# Patient Record
Sex: Female | Born: 1965 | Race: White | Hispanic: No | Marital: Married | Smoking: Never smoker
Health system: Southern US, Community
[De-identification: ages and names within clinical notes are randomized; demographics above are authoritative.]

## PROBLEM LIST (undated history)

## (undated) DIAGNOSIS — F419 Anxiety disorder, unspecified: Secondary | ICD-10-CM

## (undated) DIAGNOSIS — E559 Vitamin D deficiency, unspecified: Secondary | ICD-10-CM

## (undated) DIAGNOSIS — C801 Malignant (primary) neoplasm, unspecified: Secondary | ICD-10-CM

## (undated) DIAGNOSIS — K635 Polyp of colon: Secondary | ICD-10-CM

## (undated) HISTORY — PX: TUBAL LIGATION: SHX77

## (undated) HISTORY — DX: Anxiety disorder, unspecified: F41.9

## (undated) HISTORY — DX: Polyp of colon: K63.5

## (undated) HISTORY — DX: Vitamin D deficiency, unspecified: E55.9

---
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
## 1997-09-16 ENCOUNTER — Inpatient Hospital Stay (HOSPITAL_COMMUNITY): Admission: AD | Admit: 1997-09-16 | Discharge: 1997-09-16 | Payer: Self-pay | Admitting: Obstetrics and Gynecology

## 1997-09-19 ENCOUNTER — Inpatient Hospital Stay (HOSPITAL_COMMUNITY): Admission: AD | Admit: 1997-09-19 | Discharge: 1997-09-21 | Payer: Self-pay | Admitting: *Deleted

## 1999-01-13 ENCOUNTER — Other Ambulatory Visit: Admission: RE | Admit: 1999-01-13 | Discharge: 1999-01-13 | Payer: Self-pay | Admitting: *Deleted

## 2000-01-13 ENCOUNTER — Other Ambulatory Visit: Admission: RE | Admit: 2000-01-13 | Discharge: 2000-01-13 | Payer: Self-pay | Admitting: *Deleted

## 2000-12-15 ENCOUNTER — Other Ambulatory Visit: Admission: RE | Admit: 2000-12-15 | Discharge: 2000-12-15 | Payer: Self-pay | Admitting: Family Medicine

## 2002-09-14 ENCOUNTER — Inpatient Hospital Stay (HOSPITAL_COMMUNITY): Admission: AD | Admit: 2002-09-14 | Discharge: 2002-09-17 | Payer: Self-pay | Admitting: Obstetrics and Gynecology

## 2002-09-21 ENCOUNTER — Emergency Department (HOSPITAL_COMMUNITY): Admission: EM | Admit: 2002-09-21 | Discharge: 2002-09-21 | Payer: Self-pay | Admitting: Emergency Medicine

## 2002-09-21 ENCOUNTER — Encounter: Payer: Self-pay | Admitting: *Deleted

## 2002-10-17 ENCOUNTER — Other Ambulatory Visit: Admission: RE | Admit: 2002-10-17 | Discharge: 2002-10-17 | Payer: Self-pay | Admitting: Obstetrics and Gynecology

## 2003-08-17 HISTORY — PX: OTHER SURGICAL HISTORY: SHX169

## 2006-06-20 ENCOUNTER — Ambulatory Visit: Payer: Self-pay | Admitting: Oncology

## 2006-07-16 ENCOUNTER — Ambulatory Visit: Payer: Self-pay | Admitting: Oncology

## 2007-02-14 ENCOUNTER — Ambulatory Visit: Payer: Self-pay | Admitting: Oncology

## 2007-02-22 ENCOUNTER — Ambulatory Visit: Payer: Self-pay | Admitting: Oncology

## 2007-03-17 ENCOUNTER — Ambulatory Visit: Payer: Self-pay | Admitting: Oncology

## 2007-08-14 ENCOUNTER — Ambulatory Visit: Payer: Self-pay | Admitting: Oncology

## 2007-08-17 ENCOUNTER — Ambulatory Visit: Payer: Self-pay | Admitting: Oncology

## 2007-08-21 ENCOUNTER — Ambulatory Visit: Payer: Self-pay | Admitting: Oncology

## 2007-09-17 ENCOUNTER — Ambulatory Visit: Payer: Self-pay | Admitting: Oncology

## 2008-02-19 ENCOUNTER — Ambulatory Visit: Payer: Self-pay | Admitting: Oncology

## 2008-03-16 ENCOUNTER — Ambulatory Visit: Payer: Self-pay | Admitting: Oncology

## 2008-08-12 ENCOUNTER — Ambulatory Visit: Payer: Self-pay | Admitting: Oncology

## 2008-08-16 ENCOUNTER — Ambulatory Visit: Payer: Self-pay | Admitting: Oncology

## 2008-08-19 ENCOUNTER — Ambulatory Visit: Payer: Self-pay | Admitting: Internal Medicine

## 2008-09-16 ENCOUNTER — Ambulatory Visit: Payer: Self-pay | Admitting: Oncology

## 2008-09-16 ENCOUNTER — Ambulatory Visit: Payer: Self-pay | Admitting: Internal Medicine

## 2008-11-27 DIAGNOSIS — Z85820 Personal history of malignant melanoma of skin: Secondary | ICD-10-CM | POA: Insufficient documentation

## 2009-01-09 DIAGNOSIS — IMO0002 Reserved for concepts with insufficient information to code with codable children: Secondary | ICD-10-CM | POA: Insufficient documentation

## 2009-02-13 ENCOUNTER — Ambulatory Visit: Payer: Self-pay | Admitting: Oncology

## 2009-02-19 ENCOUNTER — Ambulatory Visit: Payer: Self-pay | Admitting: Oncology

## 2009-03-16 ENCOUNTER — Ambulatory Visit: Payer: Self-pay | Admitting: Oncology

## 2009-07-08 DIAGNOSIS — E559 Vitamin D deficiency, unspecified: Secondary | ICD-10-CM | POA: Insufficient documentation

## 2010-02-12 ENCOUNTER — Ambulatory Visit: Payer: Self-pay | Admitting: Oncology

## 2010-02-13 ENCOUNTER — Ambulatory Visit: Payer: Self-pay | Admitting: Oncology

## 2010-02-20 ENCOUNTER — Ambulatory Visit: Payer: Self-pay | Admitting: Oncology

## 2010-03-16 ENCOUNTER — Ambulatory Visit: Payer: Self-pay | Admitting: Oncology

## 2010-06-10 ENCOUNTER — Ambulatory Visit: Payer: Self-pay | Admitting: Family Medicine

## 2011-02-15 ENCOUNTER — Ambulatory Visit: Payer: Self-pay | Admitting: Oncology

## 2011-02-22 ENCOUNTER — Ambulatory Visit: Payer: Self-pay | Admitting: Oncology

## 2011-03-17 ENCOUNTER — Ambulatory Visit: Payer: Self-pay | Admitting: Oncology

## 2011-04-27 ENCOUNTER — Ambulatory Visit: Payer: Self-pay | Admitting: Family Medicine

## 2012-09-11 IMAGING — CR DG CHEST 2V
1 series · 2 of 2 positions shown · non-contrast
Comparison: none

REASON FOR EXAM: cxr h/o melenoma
COMMENTS:

PROCEDURE:     DXR - DXR CHEST PA (OR AP) AND LATERAL  - February 15, 2011  [DATE]
RESULT:     Comparison: 02/12/2010

[Series 1: view not recorded · 0.17mm/px · 2 of 2 slices shown]
[im 1/2]
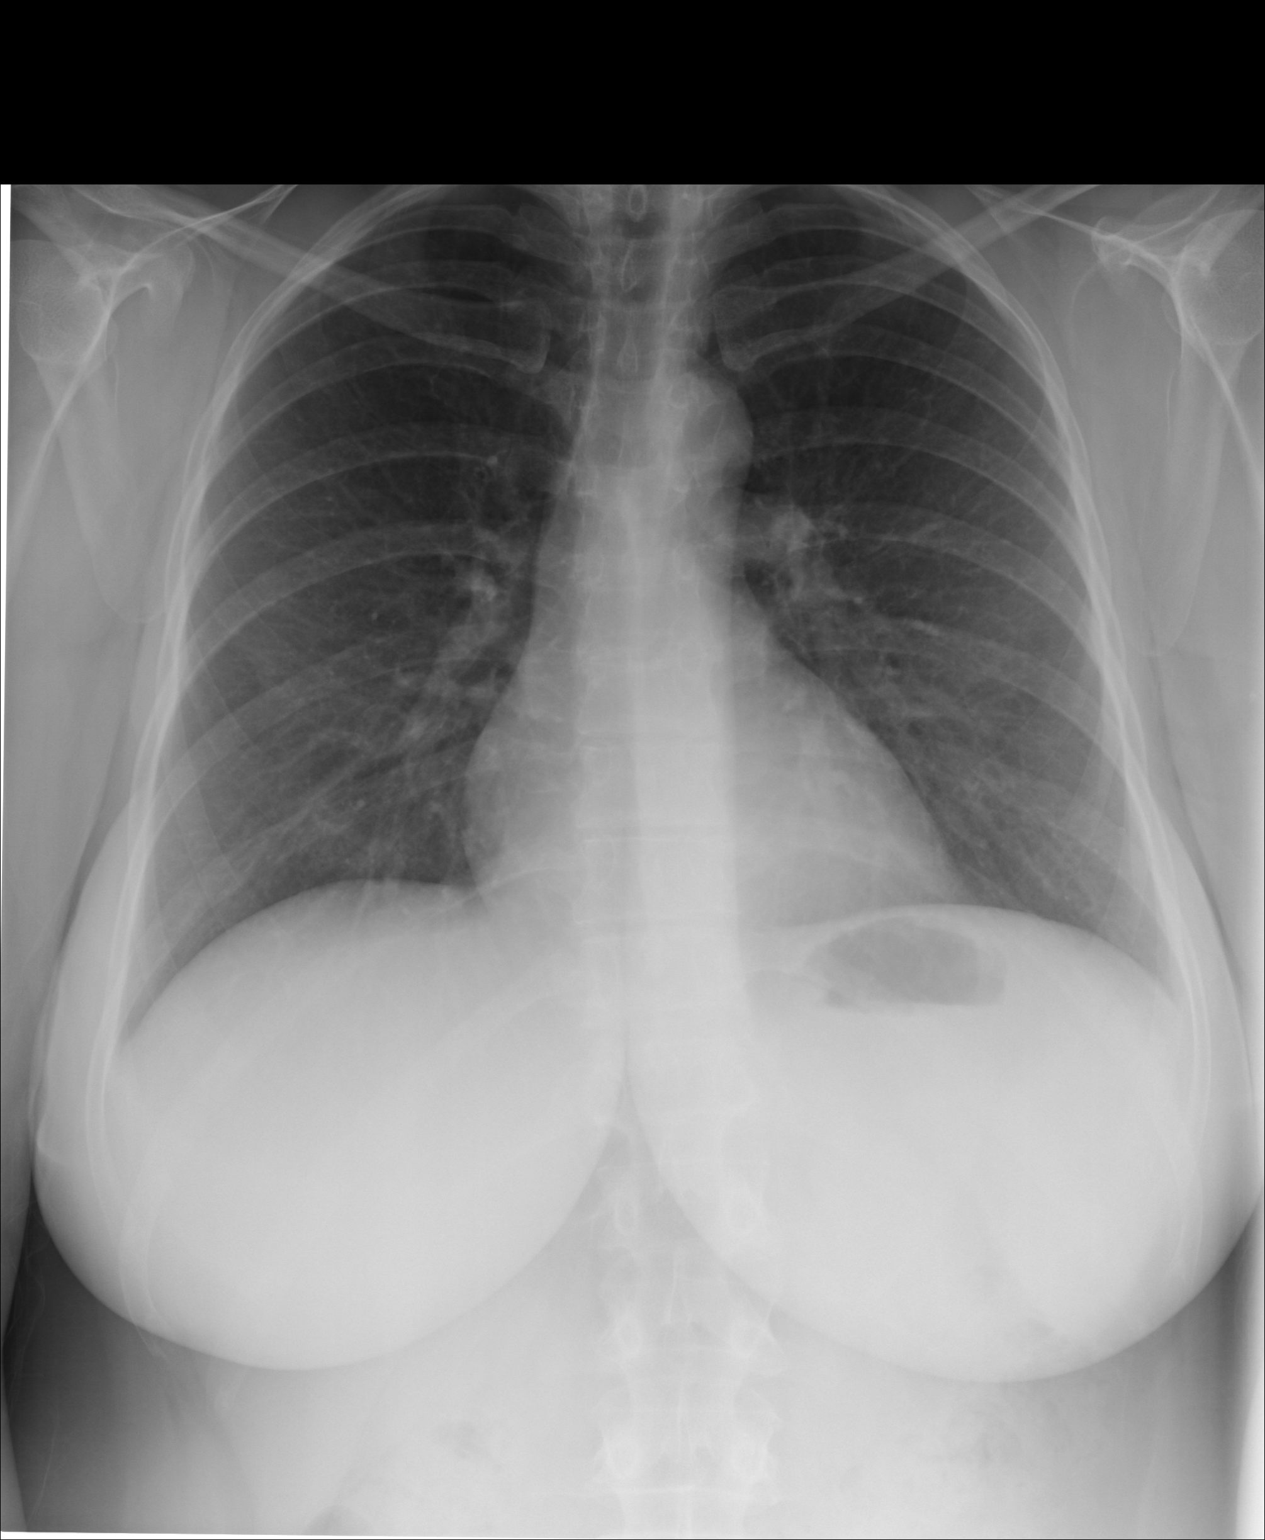
[im 2/2]
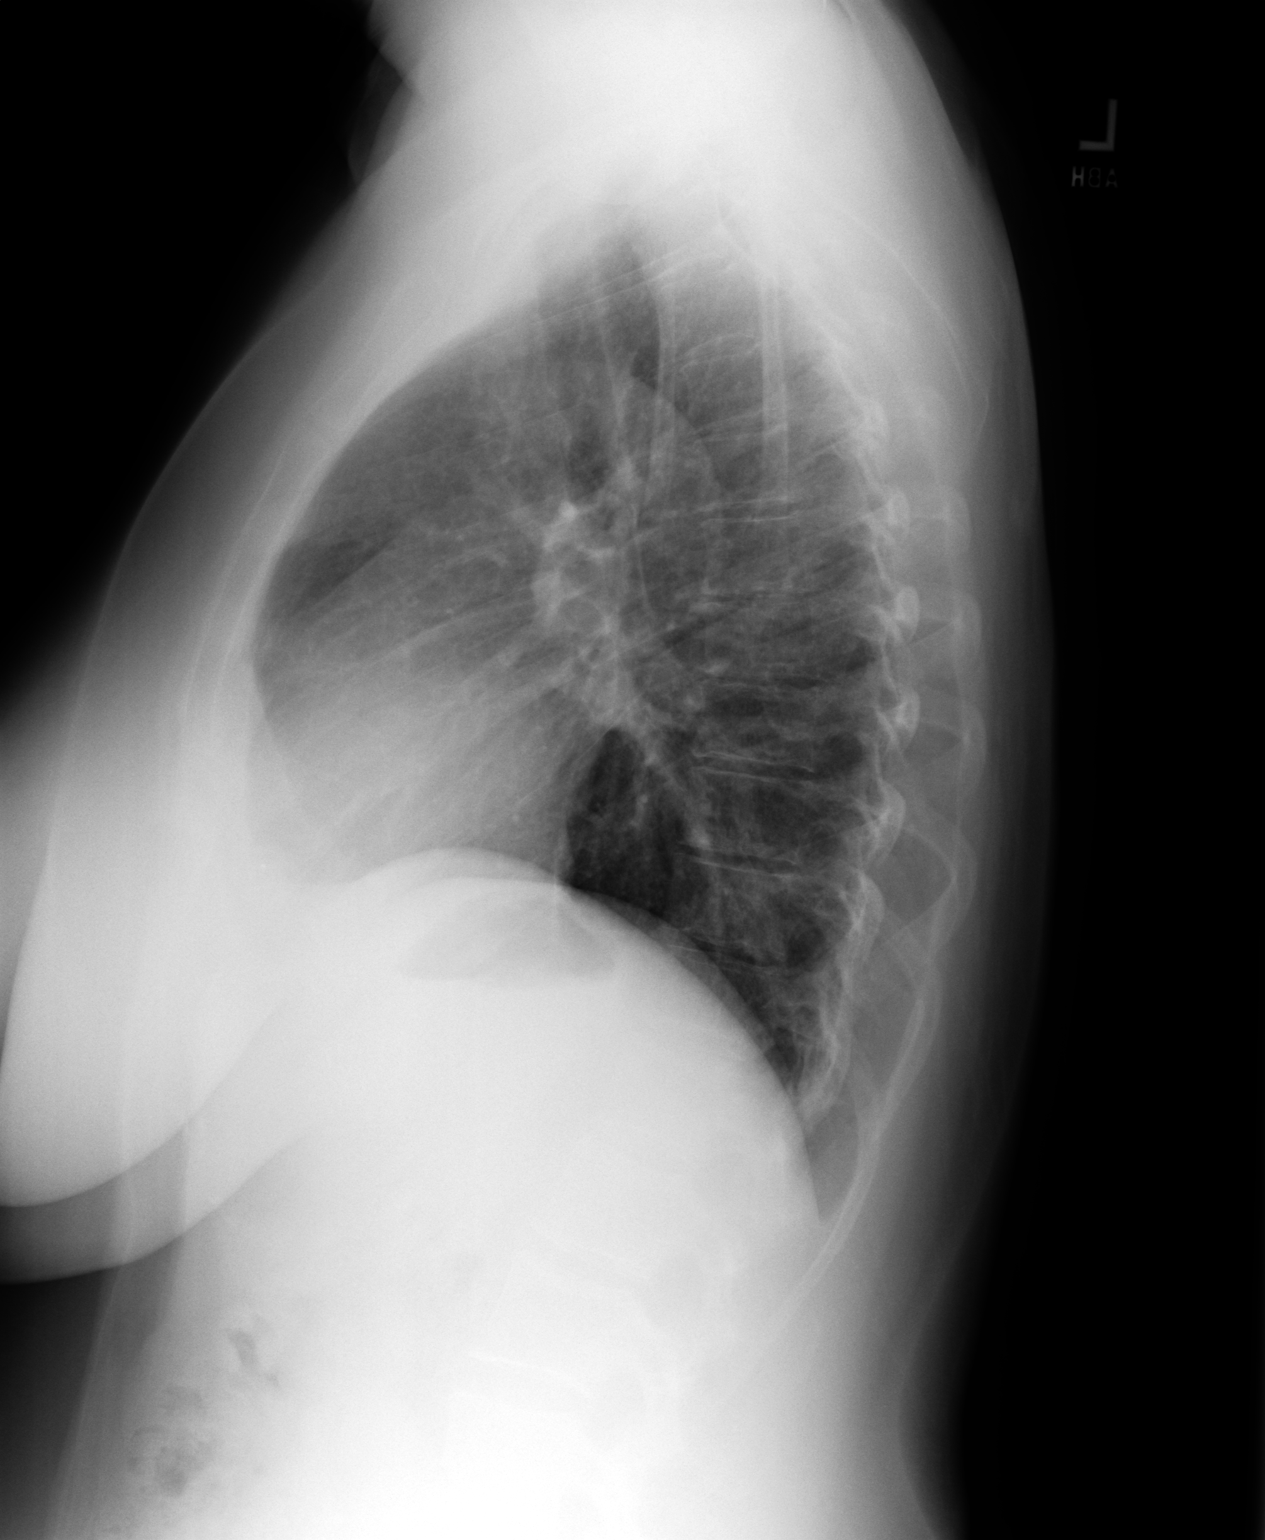

[2 of 2 positions shown; findings below may reference images not displayed]

FINDINGS: The heart and mediastinum are within normal limits. The lungs are clear.
IMPRESSION: No acute cardiopulmonary disease.

## 2013-09-18 LAB — CBC AND DIFFERENTIAL
HCT: 40 % (ref 36–46)
Hemoglobin: 13.3 g/dL (ref 12.0–16.0)
PLATELETS: 237 10*3/uL (ref 150–399)
WBC: 5.3 10^3/mL

## 2013-09-18 LAB — LIPID PANEL
CHOLESTEROL: 166 mg/dL (ref 0–200)
HDL: 63 mg/dL (ref 35–70)
LDL Cholesterol: 92 mg/dL
TRIGLYCERIDES: 57 mg/dL (ref 40–160)

## 2013-09-18 LAB — TSH: TSH: 2.38 u[IU]/mL (ref 0.41–5.90)

## 2013-09-18 LAB — HM PAP SMEAR: HM Pap smear: NEGATIVE

## 2013-09-25 ENCOUNTER — Ambulatory Visit: Payer: Self-pay | Admitting: Family Medicine

## 2015-02-18 ENCOUNTER — Other Ambulatory Visit: Payer: Self-pay | Admitting: Family Medicine

## 2015-02-18 DIAGNOSIS — F32A Depression, unspecified: Secondary | ICD-10-CM | POA: Insufficient documentation

## 2015-02-18 DIAGNOSIS — F329 Major depressive disorder, single episode, unspecified: Secondary | ICD-10-CM | POA: Insufficient documentation

## 2015-02-18 HISTORY — DX: Depression, unspecified: F32.A

## 2015-09-27 ENCOUNTER — Other Ambulatory Visit: Payer: Self-pay | Admitting: Family Medicine

## 2015-09-27 DIAGNOSIS — F32A Depression, unspecified: Secondary | ICD-10-CM

## 2015-09-27 DIAGNOSIS — F329 Major depressive disorder, single episode, unspecified: Secondary | ICD-10-CM

## 2015-09-29 NOTE — Telephone Encounter (Signed)
Last OV 11/2014  Thanks,   -Laura  

## 2015-11-04 ENCOUNTER — Other Ambulatory Visit: Payer: Self-pay | Admitting: Family Medicine

## 2015-11-04 NOTE — Telephone Encounter (Signed)
Last OV 11/2014  Thanks,   -Laura  

## 2015-12-26 ENCOUNTER — Other Ambulatory Visit: Payer: Self-pay | Admitting: Family Medicine

## 2015-12-26 DIAGNOSIS — F329 Major depressive disorder, single episode, unspecified: Secondary | ICD-10-CM

## 2015-12-26 DIAGNOSIS — F32A Depression, unspecified: Secondary | ICD-10-CM

## 2015-12-26 NOTE — Telephone Encounter (Signed)
Last OV 11/2014  Thanks,   -Valente Fosberg  

## 2016-01-05 DIAGNOSIS — Z6836 Body mass index (BMI) 36.0-36.9, adult: Secondary | ICD-10-CM | POA: Insufficient documentation

## 2016-01-05 DIAGNOSIS — R102 Pelvic and perineal pain: Secondary | ICD-10-CM | POA: Insufficient documentation

## 2016-01-05 DIAGNOSIS — F430 Acute stress reaction: Secondary | ICD-10-CM | POA: Insufficient documentation

## 2016-01-05 DIAGNOSIS — F419 Anxiety disorder, unspecified: Secondary | ICD-10-CM | POA: Insufficient documentation

## 2016-01-05 DIAGNOSIS — N920 Excessive and frequent menstruation with regular cycle: Secondary | ICD-10-CM | POA: Insufficient documentation

## 2016-01-05 HISTORY — DX: Acute stress reaction: F43.0

## 2016-01-06 ENCOUNTER — Ambulatory Visit (INDEPENDENT_AMBULATORY_CARE_PROVIDER_SITE_OTHER): Payer: BC Managed Care – PPO | Admitting: Physician Assistant

## 2016-01-06 ENCOUNTER — Encounter: Payer: Self-pay | Admitting: Physician Assistant

## 2016-01-06 VITALS — BP 108/72 | HR 72 | Temp 98.7°F | Resp 16 | Ht 63.0 in | Wt 192.2 lb

## 2016-01-06 DIAGNOSIS — Z0000 Encounter for general adult medical examination without abnormal findings: Secondary | ICD-10-CM | POA: Diagnosis not present

## 2016-01-06 DIAGNOSIS — Z1239 Encounter for other screening for malignant neoplasm of breast: Secondary | ICD-10-CM

## 2016-01-06 DIAGNOSIS — Z13220 Encounter for screening for lipoid disorders: Secondary | ICD-10-CM

## 2016-01-06 DIAGNOSIS — E559 Vitamin D deficiency, unspecified: Secondary | ICD-10-CM

## 2016-01-06 DIAGNOSIS — Z124 Encounter for screening for malignant neoplasm of cervix: Secondary | ICD-10-CM | POA: Diagnosis not present

## 2016-01-06 DIAGNOSIS — Z136 Encounter for screening for cardiovascular disorders: Secondary | ICD-10-CM | POA: Diagnosis not present

## 2016-01-06 DIAGNOSIS — Z3041 Encounter for surveillance of contraceptive pills: Secondary | ICD-10-CM | POA: Diagnosis not present

## 2016-01-06 DIAGNOSIS — F32A Depression, unspecified: Secondary | ICD-10-CM

## 2016-01-06 DIAGNOSIS — F329 Major depressive disorder, single episode, unspecified: Secondary | ICD-10-CM | POA: Diagnosis not present

## 2016-01-06 MED ORDER — DESOGESTREL-ETHINYL ESTRADIOL 0.15-0.02/0.01 MG (21/5) PO TABS: 1.0000 | ORAL_TABLET | Freq: Every day | ORAL | Status: DC

## 2016-01-06 MED ORDER — SERTRALINE HCL 50 MG PO TABS: 100.0000 mg | ORAL_TABLET | Freq: Every day | ORAL | Status: DC

## 2016-01-06 NOTE — Patient Instructions (Signed)
Health Maintenance, Female Adopting a healthy lifestyle and getting preventive care can go a long way to promote health and wellness. Talk with your health care provider about what schedule of regular examinations is right for you. This is a good chance for you to check in with your provider about disease prevention and staying healthy. In between checkups, there are plenty of things you can do on your own. Experts have done a lot of research about which lifestyle changes and preventive measures are most likely to keep you healthy. Ask your health care provider for more information. WEIGHT AND DIET  Eat a healthy diet  Be sure to include plenty of vegetables, fruits, low-fat dairy products, and lean protein.  Do not eat a lot of foods high in solid fats, added sugars, or salt.  Get regular exercise. This is one of the most important things you can do for your health.  Most adults should exercise for at least 150 minutes each week. The exercise should increase your heart rate and make you sweat (moderate-intensity exercise).  Most adults should also do strengthening exercises at least twice a week. This is in addition to the moderate-intensity exercise.  Maintain a healthy weight  Body mass index (BMI) is a measurement that can be used to identify possible weight problems. It estimates body fat based on height and weight. Your health care provider can help determine your BMI and help you achieve or maintain a healthy weight.  For females 20 years of age and older:   A BMI below 18.5 is considered underweight.  A BMI of 18.5 to 24.9 is normal.  A BMI of 25 to 29.9 is considered overweight.  A BMI of 30 and above is considered obese.  Watch levels of cholesterol and blood lipids  You should start having your blood tested for lipids and cholesterol at 48 years of age, then have this test every 5 years.  You may need to have your cholesterol levels checked more often if:  Your lipid  or cholesterol levels are high.  You are older than 48 years of age.  You are at high risk for heart disease.  CANCER SCREENING   Lung Cancer  Lung cancer screening is recommended for adults 55-80 years old who are at high risk for lung cancer because of a history of smoking.  A yearly low-dose CT scan of the lungs is recommended for people who:  Currently smoke.  Have quit within the past 15 years.  Have at least a 30-pack-year history of smoking. A pack year is smoking an average of one pack of cigarettes a day for 1 year.  Yearly screening should continue until it has been 15 years since you quit.  Yearly screening should stop if you develop a health problem that would prevent you from having lung cancer treatment.  Breast Cancer  Practice breast self-awareness. This means understanding how your breasts normally appear and feel.  It also means doing regular breast self-exams. Let your health care provider know about any changes, no matter how small.  If you are in your 20s or 30s, you should have a clinical breast exam (CBE) by a health care provider every 1-3 years as part of a regular health exam.  If you are 40 or older, have a CBE every year. Also consider having a breast X-ray (mammogram) every year.  If you have a family history of breast cancer, talk to your health care provider about genetic screening.  If you   are at high risk for breast cancer, talk to your health care provider about having an MRI and a mammogram every year.  Breast cancer gene (BRCA) assessment is recommended for women who have family members with BRCA-related cancers. BRCA-related cancers include:  Breast.  Ovarian.  Tubal.  Peritoneal cancers.  Results of the assessment will determine the need for genetic counseling and BRCA1 and BRCA2 testing. Cervical Cancer Your health care provider may recommend that you be screened regularly for cancer of the pelvic organs (ovaries, uterus, and  vagina). This screening involves a pelvic examination, including checking for microscopic changes to the surface of your cervix (Pap test). You may be encouraged to have this screening done every 3 years, beginning at age 21.  For women ages 30-65, health care providers may recommend pelvic exams and Pap testing every 3 years, or they may recommend the Pap and pelvic exam, combined with testing for human papilloma virus (HPV), every 5 years. Some types of HPV increase your risk of cervical cancer. Testing for HPV may also be done on women of any age with unclear Pap test results.  Other health care providers may not recommend any screening for nonpregnant women who are considered low risk for pelvic cancer and who do not have symptoms. Ask your health care provider if a screening pelvic exam is right for you.  If you have had past treatment for cervical cancer or a condition that could lead to cancer, you need Pap tests and screening for cancer for at least 20 years after your treatment. If Pap tests have been discontinued, your risk factors (such as having a new sexual partner) need to be reassessed to determine if screening should resume. Some women have medical problems that increase the chance of getting cervical cancer. In these cases, your health care provider may recommend more frequent screening and Pap tests. Colorectal Cancer  This type of cancer can be detected and often prevented.  Routine colorectal cancer screening usually begins at 48 years of age and continues through 48 years of age.  Your health care provider may recommend screening at an earlier age if you have risk factors for colon cancer.  Your health care provider may also recommend using home test kits to check for hidden blood in the stool.  A small camera at the end of a tube can be used to examine your colon directly (sigmoidoscopy or colonoscopy). This is done to check for the earliest forms of colorectal  cancer.  Routine screening usually begins at age 50.  Direct examination of the colon should be repeated every 5-10 years through 48 years of age. However, you may need to be screened more often if early forms of precancerous polyps or small growths are found. Skin Cancer  Check your skin from head to toe regularly.  Tell your health care provider about any new moles or changes in moles, especially if there is a change in a mole's shape or color.  Also tell your health care provider if you have a mole that is larger than the size of a pencil eraser.  Always use sunscreen. Apply sunscreen liberally and repeatedly throughout the day.  Protect yourself by wearing long sleeves, pants, a wide-brimmed hat, and sunglasses whenever you are outside. HEART DISEASE, DIABETES, AND HIGH BLOOD PRESSURE   High blood pressure causes heart disease and increases the risk of stroke. High blood pressure is more likely to develop in:  People who have blood pressure in the high end   of the normal range (130-139/85-89 mm Hg).  People who are overweight or obese.  People who are African American.  If you are 38-23 years of age, have your blood pressure checked every 3-5 years. If you are 61 years of age or older, have your blood pressure checked every year. You should have your blood pressure measured twice--once when you are at a hospital or clinic, and once when you are not at a hospital or clinic. Record the average of the two measurements. To check your blood pressure when you are not at a hospital or clinic, you can use:  An automated blood pressure machine at a pharmacy.  A home blood pressure monitor.  If you are between 45 years and 39 years old, ask your health care provider if you should take aspirin to prevent strokes.  Have regular diabetes screenings. This involves taking a blood sample to check your fasting blood sugar level.  If you are at a normal weight and have a low risk for diabetes,  have this test once every three years after 48 years of age.  If you are overweight and have a high risk for diabetes, consider being tested at a younger age or more often. PREVENTING INFECTION  Hepatitis B  If you have a higher risk for hepatitis B, you should be screened for this virus. You are considered at high risk for hepatitis B if:  You were born in a country where hepatitis B is common. Ask your health care provider which countries are considered high risk.  Your parents were born in a high-risk country, and you have not been immunized against hepatitis B (hepatitis B vaccine).  You have HIV or AIDS.  You use needles to inject street drugs.  You live with someone who has hepatitis B.  You have had sex with someone who has hepatitis B.  You get hemodialysis treatment.  You take certain medicines for conditions, including cancer, organ transplantation, and autoimmune conditions. Hepatitis C  Blood testing is recommended for:  Everyone born from [birth year] through 1963.  Anyone with known risk factors for hepatitis C. Sexually transmitted infections (STIs)  You should be screened for sexually transmitted infections (STIs) including gonorrhea and chlamydia if:  You are sexually active and are younger than 48 years of age.  You are older than 48 years of age and your health care provider tells you that you are at risk for this type of infection.  Your sexual activity has changed since you were last screened and you are at an increased risk for chlamydia or gonorrhea. Ask your health care provider if you are at risk.  If you do not have HIV, but are at risk, it may be recommended that you take a prescription medicine daily to prevent HIV infection. This is called pre-exposure prophylaxis (PrEP). You are considered at risk if:  You are sexually active and do not regularly use condoms or know the HIV status of your partner(s).  You take drugs by injection.  You are sexually  active with a partner who has HIV. Talk with your health care provider about whether you are at high risk of being infected with HIV. If you choose to begin PrEP, you should first be tested for HIV. You should then be tested every 3 months for as long as you are taking PrEP.  PREGNANCY   If you are premenopausal and you may become pregnant, ask your health care provider about preconception counseling.  If you may  become pregnant, take 400 to 800 micrograms (mcg) of folic acid every day.  If you want to prevent pregnancy, talk to your health care provider about birth control (contraception). OSTEOPOROSIS AND MENOPAUSE   Osteoporosis is a disease in which the bones lose minerals and strength with aging. This can result in serious bone fractures. Your risk for osteoporosis can be identified using a bone density scan.  If you are 61 years of age or older, or if you are at risk for osteoporosis and fractures, ask your health care provider if you should be screened.  Ask your health care provider whether you should take a calcium or vitamin D supplement to lower your risk for osteoporosis.  Menopause may have certain physical symptoms and risks.  Hormone replacement therapy may reduce some of these symptoms and risks. Talk to your health care provider about whether hormone replacement therapy is right for you.  HOME CARE INSTRUCTIONS   Schedule regular health, dental, and eye exams.  Stay current with your immunizations.   Do not use any tobacco products including cigarettes, chewing tobacco, or electronic cigarettes.  If you are pregnant, do not drink alcohol.  If you are breastfeeding, limit how much and how often you drink alcohol.  Limit alcohol intake to no more than 1 drink per day for nonpregnant women. One drink equals 12 ounces of beer, 5 ounces of wine, or 1 ounces of hard liquor.  Do not use street drugs.  Do not share needles.  Ask your health care provider for help if  you need support or information about quitting drugs.  Tell your health care provider if you often feel depressed.  Tell your health care provider if you have ever been abused or do not feel safe at home.   This information is not intended to replace advice given to you by your health care provider. Make sure you discuss any questions you have with your health care provider.   Document Released: 02/15/2011 Document Revised: 08/23/2014 Document Reviewed: 07/04/2013 Elsevier Interactive Patient Education [employer].

## 2016-01-06 NOTE — Progress Notes (Signed)
Patient: Deborah Cole, Female    DOB: 11-May-1966, 50 y.o.   MRN: [id number] Visit Date: 01/06/2016  Today's Provider: Mar Daring, PA-C   Chief Complaint  Patient presents with  . Annual Exam   Subjective:    Annual physical exam [name] is a 50 y.o. female who presents today for health maintenance and complete physical. She feels well. She reports exercising 3 times a week she walks for 30 minutes and is on her feet all day at work. She reports she is sleeping well.  Mammogram:09/25/2013- BRADs:Negative,Cat 1 Pap:09/17/13-Negative Colonoscopy: N/A Tdap: 10/20/2010 -----------------------------------------------------------------   Review of Systems  Constitutional: Negative.   HENT: Negative.   Eyes: Negative.   Respiratory: Negative.   Cardiovascular: Negative.   Gastrointestinal: Negative.   Endocrine: Negative.   Genitourinary: Negative.   Musculoskeletal: Negative.   Skin: Negative.   Allergic/Immunologic: Negative.   Neurological: Negative.   Hematological: Negative.   Psychiatric/Behavioral: Negative.     Social History      She  reports that she has never smoked. She does not have any smokeless tobacco history on file. She reports that she does not drink alcohol or use illicit drugs.       Social History   Social History  . Marital Status: Married    Spouse Name: N/A  . Number of Children: N/A  . Years of Education: N/A   Social History Main Topics  . Smoking status: Never Smoker   . Smokeless tobacco: None  . Alcohol Use: No  . Drug Use: No  . Sexual Activity: Yes   Other Topics Concern  . None   Social History Narrative    Past Medical History  Diagnosis Date  . Vitamin D deficiency   . Anxiety      Patient Active Problem List   Diagnosis Date Noted  . Acute stress disorder 01/05/2016  . Anxiety 01/05/2016  . Adult BMI 30+ 01/05/2016  . Excess, menstruation 01/05/2016  . Adnexal pain 01/05/2016  .  Depression 02/18/2015  . Avitaminosis D 07/08/2009  . Elevation of level of transaminase or lactic acid dehydrogenase (LDH) 01/09/2009  . Melanoma of skin ([place]) 11/27/2008    Past Surgical History  Procedure Laterality Date  . History of melanoma excision  2005    Malignant  . S/p bunionectomy  1984  . Tubal ligation      Family History        Family Status  Relation Status Death Age  . Mother Alive   . Father Alive   . Sister Alive   . Daughter Alive   . Son Alive   . Son Alive         Her family history includes Cancer (age of onset: 1) in her maternal grandmother.    No Known Allergies  Previous Medications   CHOLECALCIFEROL (VITAMIN D) 1000 UNITS TABLET    Take by mouth.   DESOGESTREL-ETHINYL ESTRADIOL (PIMTREA) 0.15-0.02/0.01 MG (21/5) TABLET    Take 1 tablet by mouth daily. Pt needs to schedule an office visit before anymore refills.   KETOROLAC (ACULAR) 0.5 % OPHTHALMIC SOLUTION    instill 1 drop into affected eye four times a day   MULTIPLE VITAMIN TABLET    Take by mouth.   PSYLLIUM 28.3 % POWD    Take by mouth. Reported on 01/06/2016   SERTRALINE (ZOLOFT) 50 MG TABLET    take 2 tablets by mouth once daily  Patient Care Team: Mar Daring, PA-C as PCP - General (Family Medicine)     Objective:   Vitals: BP 108/72 mmHg  Pulse 72  Temp(Src) 98.7 F (37.1 C) (Oral)  Resp 16  Ht 5\' 3"  (1.6 m)  Wt 192 lb 3.2 oz (87.181 kg)  BMI 34.06 kg/m2  LMP 12/17/2015   Physical Exam  Constitutional: She is oriented to person, place, and time. She appears well-developed and well-nourished. No distress.  HENT:  Head: Normocephalic and atraumatic.  Right Ear: Hearing, tympanic membrane, external ear and ear canal normal.  Left Ear: Hearing, tympanic membrane, external ear and ear canal normal.  Nose: Nose normal.  Mouth/Throat: Uvula is midline, oropharynx is clear and moist and mucous membranes are normal. No oropharyngeal exudate.  Eyes: Conjunctivae and  EOM are normal. Pupils are equal, round, and reactive to light. Right eye exhibits no discharge. Left eye exhibits no discharge. No scleral icterus.  Neck: Normal range of motion. Neck supple. No JVD present. Carotid bruit is not present. No tracheal deviation present. No thyromegaly present.  Cardiovascular: Normal rate, regular rhythm, normal heart sounds and intact distal pulses.  Exam reveals no gallop and no friction rub.   No murmur heard. Pulmonary/Chest: Effort normal and breath sounds normal. No respiratory distress. She has no wheezes. She has no rales. She exhibits no tenderness. Right breast exhibits no inverted nipple, no mass, no nipple discharge, no skin change and no tenderness. Left breast exhibits no inverted nipple, no mass, no nipple discharge, no skin change and no tenderness. Breasts are symmetrical.  Abdominal: Soft. Bowel sounds are normal. She exhibits no distension and no mass. There is no tenderness. There is no rebound and no guarding. Hernia confirmed negative in the right inguinal area and confirmed negative in the left inguinal area.  Genitourinary: Rectum normal, vagina normal and uterus normal. No breast swelling, tenderness, discharge or bleeding. Pelvic exam was performed with patient supine. There is no rash, tenderness, lesion or injury on the right labia. There is no rash, tenderness, lesion or injury on the left labia. Cervix exhibits no motion tenderness, no discharge and no friability. Right adnexum displays no mass, no tenderness and no fullness. Left adnexum displays no mass, no tenderness and no fullness. No erythema, tenderness or bleeding in the vagina. No signs of injury around the vagina. No vaginal discharge found.  Musculoskeletal: Normal range of motion. She exhibits no edema or tenderness.  Lymphadenopathy:    She has no cervical adenopathy.       Right: No inguinal adenopathy present.       Left: No inguinal adenopathy present.  Neurological: She is  alert and oriented to person, place, and time. She has normal reflexes. No cranial nerve deficit. Coordination normal.  Skin: Skin is warm and dry. No rash noted. She is not diaphoretic.  Psychiatric: She has a normal mood and affect. Her behavior is normal. Judgment and thought content normal.  Vitals reviewed.    Depression Screen PHQ 2/9 Scores 01/06/2016  PHQ - 2 Score 0      Assessment & Plan:     Routine Health Maintenance and Physical Exam  Exercise Activities and Dietary recommendations Goals    None      Immunization History  Administered Date(s) Administered  . Tdap 12/11/2008, 10/20/2010    Health Maintenance  Topic Date Due  . HIV Screening  06/25/1983  . INFLUENZA VACCINE  03/16/2016  . PAP SMEAR  09/18/2016  . TETANUS/TDAP  10/19/2020      Discussed health benefits of physical activity, and encouraged her to engage in regular exercise appropriate for her age and condition.   1. Annual physical exam Normal physical exam today. Will check labs as below and f/u pending lab results. If labs are stable and WNL she will not need to have these rechecked for one year at her next annual physical exam. She is to call the office in the meantime if she has any acute issue, questions or concerns. - CBC with Differential/Platelet - Comprehensive metabolic panel - TSH  2. Breast cancer screening Breast exam today was normal. There is no family history of breast cancer. She does perform regular self breast exams. Mammogram was ordered as below. Information for [facility] was given to patient so she may schedule her mammogram at her convenience. - MM DIGITAL SCREENING BILATERAL; Future  3. Cervical cancer screening Pap collected today. Will send as below and f/u pending results. - Pap IG, CT/NG NAA, and HPV (high risk)  4. Avitaminosis D H/O this and currently on Vit D 1000 IU daily. Will recheck lab and f/u pending results. - Vitamin D (25  hydroxy)  5. Encounter for lipid screening for cardiovascular disease Will check lab and f/u pending results. - Lipid panel  6. Depression Stable. Diagnosis pulled for medication refill. Continue current medical treatment plan. - sertraline (ZOLOFT) 50 MG tablet; Take 2 tablets (100 mg total) by mouth daily.  Dispense: 180 tablet; Refill: 1  7. Encounter for repeat prescription of oral contraceptives Stable. Diagnosis pulled for medication refill. Continue current medical treatment plan. - desogestrel-ethinyl estradiol (PIMTREA) 0.15-0.02/0.01 MG (21/5) tablet; Take 1 tablet by mouth daily.  Dispense: 28 tablet; Refill: 11  --------------------------------------------------------------------

## 2016-01-09 ENCOUNTER — Telehealth: Payer: Self-pay

## 2016-01-09 LAB — PAP IG, CT-NG NAA, HPV HIGH-RISK
Chlamydia, Nuc. Acid Amp: NEGATIVE
Gonococcus by Nucleic Acid Amp: NEGATIVE
HPV, HIGH-RISK: NEGATIVE
PAP SMEAR COMMENT: 0

## 2016-01-09 NOTE — Telephone Encounter (Signed)
-----   Message from Mar Daring, PA-C sent at 01/09/2016  8:23 AM EDT ----- Pap is normal.  HPV negative. GC/Chlamydia neg. Will repeat in 3 years.

## 2016-01-09 NOTE — Telephone Encounter (Signed)
Number Busy.  Thanks,  -Janeece Blok

## 2016-01-13 NOTE — Telephone Encounter (Signed)
LMTCB  Thanks,  -Bary Limbach 

## 2016-01-14 LAB — CBC WITH DIFFERENTIAL/PLATELET
BASOS ABS: 0 10*3/uL (ref 0.0–0.2)
BASOS: 0 %
EOS (ABSOLUTE): 0.1 10*3/uL (ref 0.0–0.4)
Eos: 1 %
HEMOGLOBIN: 12.8 g/dL (ref 11.1–15.9)
Hematocrit: 38.3 % (ref 34.0–46.6)
IMMATURE GRANS (ABS): 0 10*3/uL (ref 0.0–0.1)
Immature Granulocytes: 0 %
LYMPHS: 31 %
Lymphocytes Absolute: 1.5 10*3/uL (ref 0.7–3.1)
MCH: 30.1 pg (ref 26.6–33.0)
MCHC: 33.4 g/dL (ref 31.5–35.7)
MCV: 90 fL (ref 79–97)
MONOCYTES: 6 %
Monocytes Absolute: 0.3 10*3/uL (ref 0.1–0.9)
NEUTROS ABS: 3.1 10*3/uL (ref 1.4–7.0)
Neutrophils: 62 %
Platelets: 229 10*3/uL (ref 150–379)
RBC: 4.25 x10E6/uL (ref 3.77–5.28)
RDW: 13.2 % (ref 12.3–15.4)
WBC: 5 10*3/uL (ref 3.4–10.8)

## 2016-01-14 LAB — LIPID PANEL
CHOL/HDL RATIO: 2.7 ratio (ref 0.0–4.4)
Cholesterol, Total: 183 mg/dL (ref 100–199)
HDL: 68 mg/dL (ref >39)
LDL CALC: 96 mg/dL (ref 0–99)
Triglycerides: 95 mg/dL (ref 0–149)
VLDL CHOLESTEROL CAL: 19 mg/dL (ref 5–40)

## 2016-01-14 LAB — VITAMIN D 25 HYDROXY (VIT D DEFICIENCY, FRACTURES): Vit D, 25-Hydroxy: 36.2 ng/mL (ref 30.0–100.0)

## 2016-01-14 LAB — COMPREHENSIVE METABOLIC PANEL
ALBUMIN: 4.3 g/dL (ref 3.5–5.5)
ALT: 10 IU/L (ref 0–32)
AST: 13 IU/L (ref 0–40)
Albumin/Globulin Ratio: 1.9 (ref 1.2–2.2)
Alkaline Phosphatase: 63 IU/L (ref 39–117)
BILIRUBIN TOTAL: 0.5 mg/dL (ref 0.0–1.2)
BUN / CREAT RATIO: 11 (ref 9–23)
BUN: 10 mg/dL (ref 6–24)
CALCIUM: 9.3 mg/dL (ref 8.7–10.2)
CHLORIDE: 104 mmol/L (ref 96–106)
CO2: 23 mmol/L (ref 18–29)
Creatinine, Ser: 0.89 mg/dL (ref 0.57–1.00)
GFR, EST AFRICAN AMERICAN: 89 mL/min/{1.73_m2} (ref >59)
GFR, EST NON AFRICAN AMERICAN: 77 mL/min/{1.73_m2} (ref >59)
GLUCOSE: 87 mg/dL (ref 65–99)
Globulin, Total: 2.3 g/dL (ref 1.5–4.5)
Potassium: 4.6 mmol/L (ref 3.5–5.2)
Sodium: 140 mmol/L (ref 134–144)
TOTAL PROTEIN: 6.6 g/dL (ref 6.0–8.5)

## 2016-01-14 LAB — TSH: TSH: 3.35 u[IU]/mL (ref 0.450–4.500)

## 2016-01-15 NOTE — Progress Notes (Signed)
LMTCB. sd  

## 2016-01-20 NOTE — Telephone Encounter (Signed)
Patient advised as directed below.  Thanks,  -Prophet Renwick 

## 2016-08-13 ENCOUNTER — Other Ambulatory Visit: Payer: Self-pay | Admitting: Physician Assistant

## 2016-08-13 DIAGNOSIS — F32A Depression, unspecified: Secondary | ICD-10-CM

## 2016-08-13 DIAGNOSIS — F329 Major depressive disorder, single episode, unspecified: Secondary | ICD-10-CM

## 2016-08-13 NOTE — Telephone Encounter (Signed)
LOV/CPE 01/06/2016. Deborah Cole, CMA

## 2017-01-27 ENCOUNTER — Other Ambulatory Visit: Payer: Self-pay | Admitting: Physician Assistant

## 2017-01-27 DIAGNOSIS — Z3041 Encounter for surveillance of contraceptive pills: Secondary | ICD-10-CM

## 2017-02-15 ENCOUNTER — Ambulatory Visit (INDEPENDENT_AMBULATORY_CARE_PROVIDER_SITE_OTHER): Payer: BC Managed Care – PPO | Admitting: Physician Assistant

## 2017-02-15 ENCOUNTER — Encounter: Payer: Self-pay | Admitting: Physician Assistant

## 2017-02-15 VITALS — BP 120/80 | HR 78 | Temp 98.0°F | Resp 16 | Ht 62.0 in | Wt 200.0 lb

## 2017-02-15 DIAGNOSIS — Z0000 Encounter for general adult medical examination without abnormal findings: Secondary | ICD-10-CM | POA: Diagnosis not present

## 2017-02-15 DIAGNOSIS — F329 Major depressive disorder, single episode, unspecified: Secondary | ICD-10-CM | POA: Diagnosis not present

## 2017-02-15 DIAGNOSIS — Z3041 Encounter for surveillance of contraceptive pills: Secondary | ICD-10-CM

## 2017-02-15 DIAGNOSIS — E559 Vitamin D deficiency, unspecified: Secondary | ICD-10-CM

## 2017-02-15 DIAGNOSIS — F419 Anxiety disorder, unspecified: Secondary | ICD-10-CM

## 2017-02-15 DIAGNOSIS — Z6836 Body mass index (BMI) 36.0-36.9, adult: Secondary | ICD-10-CM

## 2017-02-15 DIAGNOSIS — Z1239 Encounter for other screening for malignant neoplasm of breast: Secondary | ICD-10-CM

## 2017-02-15 DIAGNOSIS — Z114 Encounter for screening for human immunodeficiency virus [HIV]: Secondary | ICD-10-CM | POA: Diagnosis not present

## 2017-02-15 DIAGNOSIS — F32A Depression, unspecified: Secondary | ICD-10-CM

## 2017-02-15 DIAGNOSIS — Z1231 Encounter for screening mammogram for malignant neoplasm of breast: Secondary | ICD-10-CM | POA: Diagnosis not present

## 2017-02-15 MED ORDER — DESOGESTREL-ETHINYL ESTRADIOL 0.15-0.02/0.01 MG (21/5) PO TABS: 1.0000 | ORAL_TABLET | Freq: Every day | ORAL | 11 refills | Status: DC

## 2017-02-15 NOTE — Progress Notes (Signed)
Patient: Deborah Cole, Female    DOB: 20-Jul-1966, 51 y.o.   MRN: [id number] Visit Date: 02/15/2017  Today's Provider: Mar Daring, PA-C   Chief Complaint  Patient presents with  . Annual Exam   Subjective:    Annual physical exam Deborah Cole is a 51 y.o. female who presents today for health maintenance and complete physical. She feels well. She reports exercising daily. She reports she is sleeping well.  12/26/15 CPE 01/06/16 Pap-neg HPV-neg 09/25/13 Mammogram-BI-RADS 1 ----------------------------------------------------------------- Patient reports that she stopped taking Zoloft since May. Patient reports she did not notice any difference with medication. Patient reports that her symptoms were still the same on medication.   Review of Systems  Constitutional: Negative.   HENT: Negative.   Eyes: Negative.   Respiratory: Negative.   Cardiovascular: Negative.   Gastrointestinal: Negative.   Endocrine: Negative.   Genitourinary: Negative.   Musculoskeletal: Negative.   Skin: Negative.   Allergic/Immunologic: Negative.   Neurological: Negative.   Hematological: Negative.   Psychiatric/Behavioral: Negative.     Social History      She  reports that she has never smoked. She has never used smokeless tobacco. She reports that she does not drink alcohol or use drugs.       Social History   Social History  . Marital status: Married    Spouse name: N/A  . Number of children: N/A  . Years of education: N/A   Social History Main Topics  . Smoking status: Never Smoker  . Smokeless tobacco: Never Used  . Alcohol use No  . Drug use: No  . Sexual activity: Yes   Other Topics Concern  . None   Social History Narrative  . None    Past Medical History:  Diagnosis Date  . Anxiety   . Vitamin D deficiency      Patient Active Problem List   Diagnosis Date Noted  . Acute stress disorder 01/05/2016  . Anxiety 01/05/2016  . Adult BMI 30+  01/05/2016  . Excess, menstruation 01/05/2016  . Adnexal pain 01/05/2016  . Depression 02/18/2015  . Avitaminosis D 07/08/2009  . Elevation of level of transaminase or lactic acid dehydrogenase (LDH) 01/09/2009  . Melanoma of skin ([place]) 11/27/2008    Past Surgical History:  Procedure Laterality Date  . History of Melanoma excision  2005   Malignant  . S/P bunionectomy  1984  . TUBAL LIGATION      Family History        Family Status  Relation Status  . Mother Alive  . Father Alive  . Sister Alive  . Daughter Alive  . Son Alive  . Son Alive  . MGM (Not Specified)        Her family history includes Cancer (age of onset: 44) in her maternal grandmother.     No Known Allergies   Current Outpatient Prescriptions:  Marland Kitchen  KARIVA 0.15-0.02/0.01 MG (21/5) tablet, take 1 tablet by mouth once daily, Disp: 28 tablet, Rfl: 1 .  ketorolac (ACULAR) 0.5 % ophthalmic solution, instill 1 drop into affected eye four times a day, Disp: , Rfl: 0 .  Multiple Vitamin tablet, Take by mouth., Disp: , Rfl:  .  Psyllium 28.3 % POWD, Take by mouth. Reported on 01/06/2016, Disp: , Rfl:  .  cholecalciferol (VITAMIN D) 1000 units tablet, Take by mouth., Disp: , Rfl:  .  sertraline (ZOLOFT) 50 MG tablet, take 2 tablets by mouth once daily (  Patient not taking: Reported on 02/15/2017), Disp: 180 tablet, Rfl: 1   Patient Care Team: Mar Daring, PA-C as PCP - General (Family Medicine)      Objective:   Vitals: BP 120/80 (BP Location: Left Arm, Patient Position: Sitting, Cuff Size: Large)   Pulse 78   Temp 98 F (36.7 C) (Oral)   Resp 16   Ht 5\' 2"  (1.575 m)   Wt 200 lb (90.7 kg)   SpO2 98%   BMI 36.58 kg/m    Vitals:   02/15/17 0955  BP: 120/80  Pulse: 78  Resp: 16  Temp: 98 F (36.7 C)  TempSrc: Oral  SpO2: 98%  Weight: 200 lb (90.7 kg)  Height: 5\' 2"  (1.575 m)     Physical Exam  Constitutional: She is oriented to person, place, and time. She appears well-developed and  well-nourished. No distress.  HENT:  Head: Normocephalic and atraumatic.  Right Ear: Hearing, tympanic membrane, external ear and ear canal normal.  Left Ear: Hearing, tympanic membrane, external ear and ear canal normal.  Nose: Nose normal.  Mouth/Throat: Uvula is midline, oropharynx is clear and moist and mucous membranes are normal. No oropharyngeal exudate.  Eyes: Conjunctivae and EOM are normal. Pupils are equal, round, and reactive to light. Right eye exhibits no discharge. Left eye exhibits no discharge. No scleral icterus.  Neck: Normal range of motion. Neck supple. No JVD present. No tracheal deviation present. No thyromegaly present.  Cardiovascular: Normal rate, regular rhythm, normal heart sounds and intact distal pulses.  Exam reveals no gallop and no friction rub.   No murmur heard. Pulmonary/Chest: Effort normal and breath sounds normal. No respiratory distress. She has no wheezes. She has no rales. She exhibits no tenderness. Right breast exhibits no inverted nipple, no mass, no nipple discharge, no skin change and no tenderness. Left breast exhibits no inverted nipple, no mass, no nipple discharge, no skin change and no tenderness. Breasts are symmetrical.  Abdominal: Soft. Bowel sounds are normal. She exhibits no distension and no mass. There is no tenderness. There is no rebound and no guarding.  Musculoskeletal: Normal range of motion. She exhibits no edema or tenderness.  Lymphadenopathy:    She has no cervical adenopathy.  Neurological: She is alert and oriented to person, place, and time.  Skin: Skin is warm and dry. No rash noted. She is not diaphoretic.  Psychiatric: She has a normal mood and affect. Her behavior is normal. Judgment and thought content normal.  Vitals reviewed.    Depression Screen PHQ 2/9 Scores 02/15/2017 01/06/2016  PHQ - 2 Score 0 0  PHQ- 9 Score 0 -      Assessment & Plan:     Routine Health Maintenance and Physical Exam  Exercise  Activities and Dietary recommendations Goals    None      Immunization History  Administered Date(s) Administered  . Tdap 12/11/2008, 10/20/2010    Health Maintenance  Topic Date Due  . HIV Screening  06/25/1983  . INFLUENZA VACCINE  03/16/2017  . PAP SMEAR  01/06/2019  . TETANUS/TDAP  10/19/2020     Discussed health benefits of physical activity, and encouraged her to engage in regular exercise appropriate for her age and condition.    1. Annual physical exam Normal physical exam today. Will check labs as below and f/u pending lab results. If labs are stable and WNL she will not need to have these rechecked for one year at her next annual physical exam. She  is to call the office in the meantime if she has any acute issue, questions or concerns. - CBC with Differential/Platelet - Comprehensive metabolic panel - Lipid Panel With LDL/HDL Ratio - TSH  2. Breast cancer screening Breast exam today was normal. There is no family history of breast cancer. She does perform regular self breast exams. Mammogram was ordered as below. Information for [facility] was given to patient so she may schedule her mammogram at her convenience. - MM DIGITAL SCREENING BILATERAL; Future  3. Depression, unspecified depression type Patient stopped Zoloft and feels she is doing ok at this time, but does state that she is out of school, she is a Pharmacist, hospital. She is going to do a trial without and may call to restart in the fall if symptoms return when she starts school back.   4. Anxiety See above medical treatment plan.  5. Avitaminosis D Will check labs as below and f/u pending results. - VITAMIN D 25 Hydroxy (Vit-D Deficiency, Fractures)  6. BMI 36.0-36.9,adult Counseled patient on healthy lifestyle modifications including dieting and exercise.   7. Encounter for screening for HIV - HIV antibody  8. Encounter for repeat prescription of oral contraceptives Stable. Diagnosis pulled for  medication refill. Continue current medical treatment plan. - desogestrel-ethinyl estradiol (KARIVA) 0.15-0.02/0.01 MG (21/5) tablet; Take 1 tablet by mouth daily.  Dispense: 28 tablet; Refill: 11  --------------------------------------------------------------------    Mar Daring, PA-C  [facility]

## 2017-02-15 NOTE — Patient Instructions (Addendum)
Please call the [facility] at [facility] to schedule this at [phone].   Health Maintenance, Female Adopting a healthy lifestyle and getting preventive care can go a long way to promote health and wellness. Talk with your health care provider about what schedule of regular examinations is right for you. This is a good chance for you to check in with your provider about disease prevention and staying healthy. In between checkups, there are plenty of things you can do on your own. Experts have done a lot of research about which lifestyle changes and preventive measures are most likely to keep you healthy. Ask your health care provider for more information. Weight and diet Eat a healthy diet  Be sure to include plenty of vegetables, fruits, low-fat dairy products, and lean protein.  Do not eat a lot of foods high in solid fats, added sugars, or salt.  Get regular exercise. This is one of the most important things you can do for your health. ? Most adults should exercise for at least 150 minutes each week. The exercise should increase your heart rate and make you sweat (moderate-intensity exercise). ? Most adults should also do strengthening exercises at least twice a week. This is in addition to the moderate-intensity exercise.  Maintain a healthy weight  Body mass index (BMI) is a measurement that can be used to identify possible weight problems. It estimates body fat based on height and weight. Your health care provider can help determine your BMI and help you achieve or maintain a healthy weight.  For females 20 years of age and older: ? A BMI below 18.5 is considered underweight. ? A BMI of 18.5 to 24.9 is normal. ? A BMI of 25 to 29.9 is considered overweight. ? A BMI of 30 and above is considered obese.  Watch levels of cholesterol and blood lipids  You should start having your blood tested for lipids and cholesterol at 49 years of age, then have  this test every 5 years.  You may need to have your cholesterol levels checked more often if: ? Your lipid or cholesterol levels are high. ? You are older than 50 years of age. ? You are at high risk for heart disease.  Cancer screening Lung Cancer  Lung cancer screening is recommended for adults 55-80 years old who are at high risk for lung cancer because of a history of smoking.  A yearly low-dose CT scan of the lungs is recommended for people who: ? Currently smoke. ? Have quit within the past 15 years. ? Have at least a 30-pack-year history of smoking. A pack year is smoking an average of one pack of cigarettes a day for 1 year.  Yearly screening should continue until it has been 15 years since you quit.  Yearly screening should stop if you develop a health problem that would prevent you from having lung cancer treatment.  Breast Cancer  Practice breast self-awareness. This means understanding how your breasts normally appear and feel.  It also means doing regular breast self-exams. Let your health care provider know about any changes, no matter how small.  If you are in your 20s or 30s, you should have a clinical breast exam (CBE) by a health care provider every 1-3 years as part of a regular health exam.  If you are 40 or older, have a CBE every year. Also consider having a breast X-ray (mammogram) every year.  If you have a family history   breast cancer, talk to your health care provider about genetic screening.  If you are at high risk for breast cancer, talk to your health care provider about having an MRI and a mammogram every year.  Breast cancer gene (BRCA) assessment is recommended for women who have family members with BRCA-related cancers. BRCA-related cancers include: ? Breast. ? Ovarian. ? Tubal. ? Peritoneal cancers.  Results of the assessment will determine the need for genetic counseling and BRCA1 and BRCA2 testing.  Cervical Cancer Your health care  provider may recommend that you be screened regularly for cancer of the pelvic organs (ovaries, uterus, and vagina). This screening involves a pelvic examination, including checking for microscopic changes to the surface of your cervix (Pap test). You may be encouraged to have this screening done every 3 years, beginning at age 39.  For women ages 66-65, health care providers may recommend pelvic exams and Pap testing every 3 years, or they may recommend the Pap and pelvic exam, combined with testing for human papilloma virus (HPV), every 5 years. Some types of HPV increase your risk of cervical cancer. Testing for HPV may also be done on women of any age with unclear Pap test results.  Other health care providers may not recommend any screening for nonpregnant women who are considered low risk for pelvic cancer and who do not have symptoms. Ask your health care provider if a screening pelvic exam is right for you.  If you have had past treatment for cervical cancer or a condition that could lead to cancer, you need Pap tests and screening for cancer for at least 20 years after your treatment. If Pap tests have been discontinued, your risk factors (such as having a new sexual partner) need to be reassessed to determine if screening should resume. Some women have medical problems that increase the chance of getting cervical cancer. In these cases, your health care provider may recommend more frequent screening and Pap tests.  Colorectal Cancer  This type of cancer can be detected and often prevented.  Routine colorectal cancer screening usually begins at 49 years of age and continues through 49 years of age.  Your health care provider may recommend screening at an earlier age if you have risk factors for colon cancer.  Your health care provider may also recommend using home test kits to check for hidden blood in the stool.  A small camera at the end of a tube can be used to examine your colon  directly (sigmoidoscopy or colonoscopy). This is done to check for the earliest forms of colorectal cancer.  Routine screening usually begins at age 51.  Direct examination of the colon should be repeated every 5-10 years through 49 years of age. However, you may need to be screened more often if early forms of precancerous polyps or small growths are found.  Skin Cancer  Check your skin from head to toe regularly.  Tell your health care provider about any new moles or changes in moles, especially if there is a change in a mole's shape or color.  Also tell your health care provider if you have a mole that is larger than the size of a pencil eraser.  Always use sunscreen. Apply sunscreen liberally and repeatedly throughout the day.  Protect yourself by wearing long sleeves, pants, a wide-brimmed hat, and sunglasses whenever you are outside.  Heart disease, diabetes, and high blood pressure  High blood pressure causes heart disease and increases the risk of stroke. High blood  pressure is more likely to develop in: ? People who have blood pressure in the high end of the normal range (130-139/85-89 mm Hg). ? People who are overweight or obese. ? People who are African American.  If you are 37-15 years of age, have your blood pressure checked every 3-5 years. If you are 50 years of age or older, have your blood pressure checked every year. You should have your blood pressure measured twice-once when you are at a hospital or clinic, and once when you are not at a hospital or clinic. Record the average of the two measurements. To check your blood pressure when you are not at a hospital or clinic, you can use: ? An automated blood pressure machine at a pharmacy. ? A home blood pressure monitor.  If you are between 54 years and 47 years old, ask your health care provider if you should take aspirin to prevent strokes.  Have regular diabetes screenings. This involves taking a blood sample to  check your fasting blood sugar level. ? If you are at a normal weight and have a low risk for diabetes, have this test once every three years after 49 years of age. ? If you are overweight and have a high risk for diabetes, consider being tested at a younger age or more often. Preventing infection Hepatitis B  If you have a higher risk for hepatitis B, you should be screened for this virus. You are considered at high risk for hepatitis B if: ? You were born in a country where hepatitis B is common. Ask your health care provider which countries are considered high risk. ? Your parents were born in a high-risk country, and you have not been immunized against hepatitis B (hepatitis B vaccine). ? You have HIV or AIDS. ? You use needles to inject street drugs. ? You live with someone who has hepatitis B. ? You have had sex with someone who has hepatitis B. ? You get hemodialysis treatment. ? You take certain medicines for conditions, including cancer, organ transplantation, and autoimmune conditions.  Hepatitis C  Blood testing is recommended for: ? Everyone born from [birth year] through 1963. ? Anyone with known risk factors for hepatitis C.  Sexually transmitted infections (STIs)  You should be screened for sexually transmitted infections (STIs) including gonorrhea and chlamydia if: ? You are sexually active and are younger than 49 years of age. ? You are older than 49 years of age and your health care provider tells you that you are at risk for this type of infection. ? Your sexual activity has changed since you were last screened and you are at an increased risk for chlamydia or gonorrhea. Ask your health care provider if you are at risk.  If you do not have HIV, but are at risk, it may be recommended that you take a prescription medicine daily to prevent HIV infection. This is called pre-exposure prophylaxis (PrEP). You are considered at risk if: ? You are sexually active and do not regularly  use condoms or know the HIV status of your partner(s). ? You take drugs by injection. ? You are sexually active with a partner who has HIV.  Talk with your health care provider about whether you are at high risk of being infected with HIV. If you choose to begin PrEP, you should first be tested for HIV. You should then be tested every 3 months for as long as you are taking PrEP. Pregnancy  If you are premenopausal  premenopausal and you may become pregnant, ask your health care provider about preconception counseling.  If you may become pregnant, take 400 to 800 micrograms (mcg) of folic acid every day.  If you want to prevent pregnancy, talk to your health care provider about birth control (contraception). Osteoporosis and menopause  Osteoporosis is a disease in which the bones lose minerals and strength with aging. This can result in serious bone fractures. Your risk for osteoporosis can be identified using a bone density scan.  If you are 65 years of age or older, or if you are at risk for osteoporosis and fractures, ask your health care provider if you should be screened.  Ask your health care provider whether you should take a calcium or vitamin D supplement to lower your risk for osteoporosis.  Menopause may have certain physical symptoms and risks.  Hormone replacement therapy may reduce some of these symptoms and risks. Talk to your health care provider about whether hormone replacement therapy is right for you. Follow these instructions at home:  Schedule regular health, dental, and eye exams.  Stay current with your immunizations.  Do not use any tobacco products including cigarettes, chewing tobacco, or electronic cigarettes.  If you are pregnant, do not drink alcohol.  If you are breastfeeding, limit how much and how often you drink alcohol.  Limit alcohol intake to no more than 1 drink per day for nonpregnant women. One drink equals 12 ounces of beer, 5 ounces of wine, or 1 ounces  of hard liquor.  Do not use street drugs.  Do not share needles.  Ask your health care provider for help if you need support or information about quitting drugs.  Tell your health care provider if you often feel depressed.  Tell your health care provider if you have ever been abused or do not feel safe at home. This information is not intended to replace advice given to you by your health care provider. Make sure you discuss any questions you have with your health care provider. Document Released: 02/15/2011 Document Revised: 01/08/2016 Document Reviewed: 05/06/2015 Elsevier Interactive Patient Education  2018 [employer]   

## 2017-02-23 LAB — LIPID PANEL WITH LDL/HDL RATIO
Cholesterol, Total: 166 mg/dL (ref 100–199)
HDL: 60 mg/dL
LDL Calculated: 90 mg/dL (ref 0–99)
LDl/HDL Ratio: 1.5 ratio (ref 0.0–3.2)
Triglycerides: 82 mg/dL (ref 0–149)
VLDL Cholesterol Cal: 16 mg/dL (ref 5–40)

## 2017-02-23 LAB — CBC WITH DIFFERENTIAL/PLATELET
BASOS: 0 %
Basophils Absolute: 0 10*3/uL (ref 0.0–0.2)
EOS (ABSOLUTE): 0.1 10*3/uL (ref 0.0–0.4)
Eos: 1 %
Hematocrit: 41.3 % (ref 34.0–46.6)
Hemoglobin: 13.8 g/dL (ref 11.1–15.9)
IMMATURE GRANS (ABS): 0 10*3/uL (ref 0.0–0.1)
Immature Granulocytes: 0 %
LYMPHS: 41 %
Lymphocytes Absolute: 1.8 10*3/uL (ref 0.7–3.1)
MCH: 30.4 pg (ref 26.6–33.0)
MCHC: 33.4 g/dL (ref 31.5–35.7)
MCV: 91 fL (ref 79–97)
MONOS ABS: 0 10*3/uL — AB (ref 0.1–0.9)
Monocytes: 1 %
NEUTROS ABS: 2.4 10*3/uL (ref 1.4–7.0)
Neutrophils: 57 %
PLATELETS: 210 10*3/uL (ref 150–379)
RBC: 4.54 x10E6/uL (ref 3.77–5.28)
RDW: 13.7 % (ref 12.3–15.4)
WBC: 4.3 10*3/uL (ref 3.4–10.8)

## 2017-02-23 LAB — COMPREHENSIVE METABOLIC PANEL
A/G RATIO: 1.8 (ref 1.2–2.2)
ALT: 17 IU/L (ref 0–32)
AST: 11 IU/L (ref 0–40)
Albumin: 4.4 g/dL (ref 3.5–5.5)
Alkaline Phosphatase: 58 IU/L (ref 39–117)
BUN / CREAT RATIO: 21 (ref 9–23)
BUN: 18 mg/dL (ref 6–24)
Bilirubin Total: 0.6 mg/dL (ref 0.0–1.2)
CALCIUM: 9.3 mg/dL (ref 8.7–10.2)
CHLORIDE: 107 mmol/L — AB (ref 96–106)
CO2: 17 mmol/L — ABNORMAL LOW (ref 20–29)
Creatinine, Ser: 0.85 mg/dL (ref 0.57–1.00)
GFR, EST AFRICAN AMERICAN: 94 mL/min/{1.73_m2} (ref >59)
GFR, EST NON AFRICAN AMERICAN: 81 mL/min/{1.73_m2} (ref >59)
GLOBULIN, TOTAL: 2.5 g/dL (ref 1.5–4.5)
Glucose: 90 mg/dL (ref 65–99)
POTASSIUM: 4.4 mmol/L (ref 3.5–5.2)
SODIUM: 141 mmol/L (ref 134–144)
TOTAL PROTEIN: 6.9 g/dL (ref 6.0–8.5)

## 2017-02-23 LAB — HIV ANTIBODY (ROUTINE TESTING W REFLEX): HIV Screen 4th Generation wRfx: NONREACTIVE

## 2017-02-23 LAB — VITAMIN D 25 HYDROXY (VIT D DEFICIENCY, FRACTURES): VIT D 25 HYDROXY: 38.4 ng/mL (ref 30.0–100.0)

## 2017-02-23 LAB — TSH: TSH: 3.96 u[IU]/mL (ref 0.450–4.500)

## 2017-03-03 ENCOUNTER — Telehealth: Payer: Self-pay

## 2017-03-03 NOTE — Telephone Encounter (Signed)
-----   Message from Mar Daring, PA-C sent at 02/23/2017  8:33 AM EDT ----- All labs are stable and WNL. There is one level of the CBC that came back at 0. I think it is a inaccurate. I would recommend to recheck a cbc in 4 weeks or so just to make sure this is an inaccurate reading. She can just call for lab slip.

## 2017-03-03 NOTE — Telephone Encounter (Signed)
Patient advised as below. Patient verbalizes understanding and is in agreement with treatment plan.  

## 2017-03-09 ENCOUNTER — Ambulatory Visit
Admission: RE | Admit: 2017-03-09 | Discharge: 2017-03-09 | Disposition: A | Discharge status: Home / Self Care | Payer: BC Managed Care – PPO | Source: Ambulatory Visit | Attending: Physician Assistant | Admitting: Physician Assistant

## 2017-03-09 DIAGNOSIS — Z1231 Encounter for screening mammogram for malignant neoplasm of breast: Secondary | ICD-10-CM | POA: Diagnosis present

## 2017-03-09 DIAGNOSIS — Z1239 Encounter for other screening for malignant neoplasm of breast: Secondary | ICD-10-CM

## 2017-03-09 HISTORY — DX: Malignant (primary) neoplasm, unspecified: C80.1

## 2017-04-26 ENCOUNTER — Other Ambulatory Visit: Payer: Self-pay | Admitting: Physician Assistant

## 2017-04-26 DIAGNOSIS — Z3041 Encounter for surveillance of contraceptive pills: Secondary | ICD-10-CM

## 2018-02-17 ENCOUNTER — Encounter: Payer: BC Managed Care – PPO | Admitting: Physician Assistant

## 2018-02-20 ENCOUNTER — Encounter: Payer: Self-pay | Admitting: Physician Assistant

## 2018-02-23 ENCOUNTER — Ambulatory Visit (INDEPENDENT_AMBULATORY_CARE_PROVIDER_SITE_OTHER): Payer: BC Managed Care – PPO | Admitting: Physician Assistant

## 2018-02-23 ENCOUNTER — Encounter: Payer: Self-pay | Admitting: Physician Assistant

## 2018-02-23 VITALS — BP 110/70 | HR 77 | Temp 98.4°F | Resp 16 | Ht 62.0 in | Wt 197.4 lb

## 2018-02-23 DIAGNOSIS — Z131 Encounter for screening for diabetes mellitus: Secondary | ICD-10-CM

## 2018-02-23 DIAGNOSIS — Z0000 Encounter for general adult medical examination without abnormal findings: Secondary | ICD-10-CM

## 2018-02-23 DIAGNOSIS — Z13220 Encounter for screening for lipoid disorders: Secondary | ICD-10-CM

## 2018-02-23 DIAGNOSIS — Z3041 Encounter for surveillance of contraceptive pills: Secondary | ICD-10-CM | POA: Diagnosis not present

## 2018-02-23 DIAGNOSIS — Z1231 Encounter for screening mammogram for malignant neoplasm of breast: Secondary | ICD-10-CM | POA: Diagnosis not present

## 2018-02-23 DIAGNOSIS — Z136 Encounter for screening for cardiovascular disorders: Secondary | ICD-10-CM

## 2018-02-23 DIAGNOSIS — Z1239 Encounter for other screening for malignant neoplasm of breast: Secondary | ICD-10-CM

## 2018-02-23 MED ORDER — DESOGESTREL-ETHINYL ESTRADIOL 0.15-0.02/0.01 MG (21/5) PO TABS: 1.0000 | ORAL_TABLET | Freq: Every day | ORAL | 11 refills | Status: DC

## 2018-02-23 NOTE — Patient Instructions (Signed)

## 2018-02-23 NOTE — Progress Notes (Signed)
Patient: Deborah Cole, Female    DOB: 10/11/1965, 52 y.o.   MRN: [id number] Visit Date: 02/23/2018  Today's Provider: Mar Daring, PA-C   Chief Complaint  Patient presents with  . Annual Exam   Subjective:    Annual physical exam Deborah Cole is a 52 y.o. female who presents today for health maintenance and complete physical. She feels well. She reports exercising. She reports she is sleeping fairly well.  Last CPE:02/15/17 Mammogram:03/09/17 BI-RADS 1 Pap:01/06/16 Normal,HPV-Negative. -----------------------------------------------------------------   Review of Systems  Constitutional: Negative.   HENT: Negative.   Eyes: Negative.   Respiratory: Negative.   Cardiovascular: Negative.   Gastrointestinal: Negative.   Endocrine: Negative.   Genitourinary: Negative.   Musculoskeletal: Negative.   Skin: Negative.   Allergic/Immunologic: Negative.   Neurological: Negative.   Hematological: Negative.   Psychiatric/Behavioral: Negative.     Social History      She  reports that she has never smoked. She has never used smokeless tobacco. She reports that she does not drink alcohol or use drugs.       Social History   Socioeconomic History  . Marital status: Married    Spouse name: Not on file  . Number of children: Not on file  . Years of education: Not on file  . Highest education level: Not on file  Occupational History  . Not on file  Social Needs  . Financial resource strain: Not on file  . Food insecurity:    Worry: Not on file    Inability: Not on file  . Transportation needs:    Medical: Not on file    Non-medical: Not on file  Tobacco Use  . Smoking status: Never Smoker  . Smokeless tobacco: Never Used  Substance and Sexual Activity  . Alcohol use: No  . Drug use: No  . Sexual activity: Yes  Lifestyle  . Physical activity:    Days per week: Not on file    Minutes per session: Not on file  . Stress: Not on file  Relationships   . Social connections:    Talks on phone: Not on file    Gets together: Not on file    Attends religious service: Not on file    Active member of club or organization: Not on file    Attends meetings of clubs or organizations: Not on file    Relationship status: Not on file  Other Topics Concern  . Not on file  Social History Narrative  . Not on file    Past Medical History:  Diagnosis Date  . Anxiety   . Cancer ([place])    melanoma  . Vitamin D deficiency      Patient Active Problem List   Diagnosis Date Noted  . Acute stress disorder 01/05/2016  . Anxiety 01/05/2016  . BMI 36.0-36.9,adult 01/05/2016  . Excess, menstruation 01/05/2016  . Adnexal pain 01/05/2016  . Depression 02/18/2015  . Avitaminosis D 07/08/2009  . Elevation of level of transaminase or lactic acid dehydrogenase (LDH) 01/09/2009  . Melanoma of skin ([place]) 11/27/2008    Past Surgical History:  Procedure Laterality Date  . History of Melanoma excision  2005   Malignant  . S/P bunionectomy  1984  . TUBAL LIGATION      Family History        Family Status  Relation Name Status  . Mother  Alive  . Father  Alive  . Sister  Alive  .  Daughter  Alive  . Son  Alive  . Son  Alive  . MGM  (Not Specified)        Her family history includes Cancer (age of onset: 31) in her maternal grandmother.      No Known Allergies   Current Outpatient Medications:  .  BEKYREE 0.15-0.02/0.01 MG (21/5) tablet, TAKE 1 TABLET BY MOUTH ONCE DAILY, Disp: 1 Package, Rfl: 11 .  cholecalciferol (VITAMIN D) 1000 units tablet, Take by mouth., Disp: , Rfl:  .  Multiple Vitamin tablet, Take by mouth., Disp: , Rfl:  .  Psyllium 28.3 % POWD, Take by mouth. Reported on 01/06/2016, Disp: , Rfl:    Patient Care Team: Mar Daring, PA-C as PCP - General (Family Medicine)      Objective:   Vitals: BP 110/70 (BP Location: Left Arm, Patient Position: Sitting, Cuff Size: Normal)   Pulse 77   Temp 98.4 F (36.9 C)  (Oral)   Resp 16   Ht 5\' 2"  (1.575 m)   Wt 197 lb 6.4 oz (89.5 kg)   LMP 02/08/2018   SpO2 98%   BMI 36.10 kg/m    Vitals:   02/23/18 1116  BP: 110/70  Pulse: 77  Resp: 16  Temp: 98.4 F (36.9 C)  TempSrc: Oral  SpO2: 98%  Weight: 197 lb 6.4 oz (89.5 kg)  Height: 5\' 2"  (1.575 m)     Physical Exam  Constitutional: She is oriented to person, place, and time. She appears well-developed and well-nourished. No distress.  HENT:  Head: Normocephalic and atraumatic.  Right Ear: Hearing, tympanic membrane, external ear and ear canal normal.  Left Ear: Hearing, tympanic membrane, external ear and ear canal normal.  Nose: Nose normal.  Mouth/Throat: Uvula is midline, oropharynx is clear and moist and mucous membranes are normal. No oropharyngeal exudate.  Eyes: Pupils are equal, round, and reactive to light. Conjunctivae and EOM are normal. Right eye exhibits no discharge. Left eye exhibits no discharge. No scleral icterus.  Neck: Normal range of motion. Neck supple. No JVD present. No tracheal deviation present. No thyromegaly present.  Cardiovascular: Normal rate, regular rhythm, normal heart sounds and intact distal pulses. Exam reveals no gallop and no friction rub.  No murmur heard. Pulmonary/Chest: Effort normal and breath sounds normal. No respiratory distress. She has no wheezes. She has no rales. She exhibits no tenderness. Right breast exhibits no inverted nipple, no mass, no nipple discharge, no skin change and no tenderness. Left breast exhibits no inverted nipple, no mass, no nipple discharge, no skin change and no tenderness. No breast swelling, tenderness, discharge or bleeding. Breasts are symmetrical.  Abdominal: Soft. Bowel sounds are normal. She exhibits no distension and no mass. There is no tenderness. There is no rebound and no guarding.  Musculoskeletal: Normal range of motion. She exhibits no edema or tenderness.  Lymphadenopathy:    She has no cervical adenopathy.    Neurological: She is alert and oriented to person, place, and time.  Skin: Skin is warm and dry. No rash noted. She is not diaphoretic.  Psychiatric: She has a normal mood and affect. Her behavior is normal. Judgment and thought content normal.  Vitals reviewed.    Depression Screen PHQ 2/9 Scores 02/23/2018 02/15/2017 01/06/2016  PHQ - 2 Score 0 0 0  PHQ- 9 Score 2 0 -      Assessment & Plan:     Routine Health Maintenance and Physical Exam  Exercise Activities and Dietary recommendations Goals  None      Immunization History  Administered Date(s) Administered  . Tdap 12/11/2008, 10/20/2010    Health Maintenance  Topic Date Due  . INFLUENZA VACCINE  03/16/2018  . PAP SMEAR  01/06/2019  . TETANUS/TDAP  10/19/2020  . HIV Screening  Completed     Discussed health benefits of physical activity, and encouraged her to engage in regular exercise appropriate for her age and condition.    1. Annual physical exam Normal physical exam today. Will check labs as below and f/u pending lab results. If labs are stable and WNL she will not need to have these rechecked for one year at her next annual physical exam. She is to call the office in the meantime if she has any acute issue, questions or concerns. - CBC with Differential/Platelet - Comprehensive metabolic panel - TSH  2. Breast cancer screening Breast exam today was normal. There is no family history of breast cancer. She does perform regular self breast exams. Mammogram was ordered as below. Information for [facility] clinic was given to patient so she may schedule her mammogram at her convenience. - MM DIGITAL SCREENING BILATERAL; Future  3. Encounter for lipid screening for cardiovascular disease - Lipid panel  4. Encounter for screening for diabetes mellitus - Hemoglobin A1c  5. Encounter for repeat prescription of oral contraceptives Stable. Diagnosis pulled for medication refill. Continue current medical  treatment plan. - desogestrel-ethinyl estradiol (BEKYREE) 0.15-0.02/0.01 MG (21/5) tablet; Take 1 tablet by mouth daily.  Dispense: 1 Package; Refill: 11  --------------------------------------------------------------------    Mar Daring, PA-C  [facility]

## 2018-02-24 ENCOUNTER — Telehealth: Payer: Self-pay

## 2018-02-24 LAB — CBC WITH DIFFERENTIAL/PLATELET
BASOS ABS: 0 10*3/uL (ref 0.0–0.2)
Basos: 0 %
EOS (ABSOLUTE): 0.1 10*3/uL (ref 0.0–0.4)
Eos: 1 %
HEMATOCRIT: 40.2 % (ref 34.0–46.6)
HEMOGLOBIN: 13 g/dL (ref 11.1–15.9)
Immature Grans (Abs): 0 10*3/uL (ref 0.0–0.1)
Immature Granulocytes: 0 %
LYMPHS ABS: 2.1 10*3/uL (ref 0.7–3.1)
Lymphs: 37 %
MCH: 29.9 pg (ref 26.6–33.0)
MCHC: 32.3 g/dL (ref 31.5–35.7)
MCV: 92 fL (ref 79–97)
MONOCYTES: 8 %
MONOS ABS: 0.5 10*3/uL (ref 0.1–0.9)
NEUTROS ABS: 3 10*3/uL (ref 1.4–7.0)
Neutrophils: 54 %
Platelets: 254 10*3/uL (ref 150–450)
RBC: 4.35 x10E6/uL (ref 3.77–5.28)
RDW: 13.5 % (ref 12.3–15.4)
WBC: 5.6 10*3/uL (ref 3.4–10.8)

## 2018-02-24 LAB — HEMOGLOBIN A1C
ESTIMATED AVERAGE GLUCOSE: 97 mg/dL
Hgb A1c MFr Bld: 5 % (ref 4.8–5.6)

## 2018-02-24 LAB — COMPREHENSIVE METABOLIC PANEL
A/G RATIO: 1.9 (ref 1.2–2.2)
ALT: 16 IU/L (ref 0–32)
AST: 14 IU/L (ref 0–40)
Albumin: 4.4 g/dL (ref 3.5–5.5)
Alkaline Phosphatase: 64 IU/L (ref 39–117)
BUN/Creatinine Ratio: 13 (ref 9–23)
BUN: 11 mg/dL (ref 6–24)
Bilirubin Total: 0.7 mg/dL (ref 0.0–1.2)
CALCIUM: 9.4 mg/dL (ref 8.7–10.2)
CO2: 19 mmol/L — ABNORMAL LOW (ref 20–29)
Chloride: 104 mmol/L (ref 96–106)
Creatinine, Ser: 0.85 mg/dL (ref 0.57–1.00)
GFR, EST AFRICAN AMERICAN: 93 mL/min/{1.73_m2} (ref >59)
GFR, EST NON AFRICAN AMERICAN: 81 mL/min/{1.73_m2} (ref >59)
Globulin, Total: 2.3 g/dL (ref 1.5–4.5)
Glucose: 82 mg/dL (ref 65–99)
Potassium: 4.1 mmol/L (ref 3.5–5.2)
Sodium: 138 mmol/L (ref 134–144)
TOTAL PROTEIN: 6.7 g/dL (ref 6.0–8.5)

## 2018-02-24 LAB — LIPID PANEL
CHOLESTEROL TOTAL: 184 mg/dL (ref 100–199)
Chol/HDL Ratio: 3 ratio (ref 0.0–4.4)
HDL: 62 mg/dL (ref >39)
LDL CALC: 103 mg/dL — AB (ref 0–99)
TRIGLYCERIDES: 95 mg/dL (ref 0–149)
VLDL CHOLESTEROL CAL: 19 mg/dL (ref 5–40)

## 2018-02-24 LAB — TSH: TSH: 3.68 u[IU]/mL (ref 0.450–4.500)

## 2018-02-24 NOTE — Telephone Encounter (Signed)
LMTCB  Thanks,  -Joseline 

## 2018-02-24 NOTE — Telephone Encounter (Signed)
-----   Message from Mar Daring, [place] sent at 02/24/2018 12:53 PM EDT ----- All labs are within normal limits and stable.  Thanks! -JB

## 2018-02-27 NOTE — Telephone Encounter (Signed)
LMTCB

## 2018-03-06 NOTE — Telephone Encounter (Signed)
Patient advised.

## 2018-03-31 ENCOUNTER — Ambulatory Visit
Admission: RE | Admit: 2018-03-31 | Discharge: 2018-03-31 | Disposition: A | Discharge status: Home / Self Care | Payer: BC Managed Care – PPO | Source: Ambulatory Visit | Attending: Physician Assistant | Admitting: Physician Assistant

## 2018-03-31 ENCOUNTER — Telehealth: Payer: Self-pay

## 2018-03-31 DIAGNOSIS — Z1231 Encounter for screening mammogram for malignant neoplasm of breast: Secondary | ICD-10-CM | POA: Insufficient documentation

## 2018-03-31 DIAGNOSIS — Z1239 Encounter for other screening for malignant neoplasm of breast: Secondary | ICD-10-CM

## 2018-03-31 NOTE — Telephone Encounter (Signed)
-----   Message from Mar Daring, PA-C sent at 03/31/2018  1:10 PM EDT ----- Normal mammogram. Repeat screening in one year.

## 2018-03-31 NOTE — Telephone Encounter (Signed)
lmtcb

## 2018-04-04 NOTE — Telephone Encounter (Signed)
Left detailed message on vm, per DPR.

## 2018-04-04 NOTE — Telephone Encounter (Signed)
lmtcb

## 2019-03-05 ENCOUNTER — Other Ambulatory Visit: Payer: Self-pay

## 2019-03-05 ENCOUNTER — Ambulatory Visit (INDEPENDENT_AMBULATORY_CARE_PROVIDER_SITE_OTHER): Payer: BC Managed Care – PPO | Admitting: Physician Assistant

## 2019-03-05 ENCOUNTER — Encounter: Payer: Self-pay | Admitting: Physician Assistant

## 2019-03-05 VITALS — BP 120/79 | HR 85 | Temp 98.5°F | Ht 61.0 in | Wt 200.0 lb

## 2019-03-05 DIAGNOSIS — Z3041 Encounter for surveillance of contraceptive pills: Secondary | ICD-10-CM | POA: Diagnosis not present

## 2019-03-05 DIAGNOSIS — Z1239 Encounter for other screening for malignant neoplasm of breast: Secondary | ICD-10-CM

## 2019-03-05 DIAGNOSIS — Z6837 Body mass index (BMI) 37.0-37.9, adult: Secondary | ICD-10-CM

## 2019-03-05 DIAGNOSIS — Z0000 Encounter for general adult medical examination without abnormal findings: Secondary | ICD-10-CM

## 2019-03-05 DIAGNOSIS — Z1211 Encounter for screening for malignant neoplasm of colon: Secondary | ICD-10-CM | POA: Diagnosis not present

## 2019-03-05 DIAGNOSIS — C439 Malignant melanoma of skin, unspecified: Secondary | ICD-10-CM

## 2019-03-05 DIAGNOSIS — E66812 Obesity, class 2: Secondary | ICD-10-CM

## 2019-03-05 DIAGNOSIS — R740 Nonspecific elevation of levels of transaminase and lactic acid dehydrogenase [LDH]: Secondary | ICD-10-CM

## 2019-03-05 DIAGNOSIS — IMO0002 Reserved for concepts with insufficient information to code with codable children: Secondary | ICD-10-CM

## 2019-03-05 DIAGNOSIS — Z85820 Personal history of malignant melanoma of skin: Secondary | ICD-10-CM | POA: Insufficient documentation

## 2019-03-05 DIAGNOSIS — E6609 Other obesity due to excess calories: Secondary | ICD-10-CM

## 2019-03-05 MED ORDER — DESOGESTREL-ETHINYL ESTRADIOL 0.15-0.02/0.01 MG (21/5) PO TABS: 1.0000 | ORAL_TABLET | Freq: Every day | ORAL | 11 refills | Status: DC

## 2019-03-05 NOTE — Progress Notes (Signed)
Patient: Deborah Cole, Female    DOB: 01/25/1966, 53 y.o.   MRN: [id number] Visit Date: 03/05/2019  Today's Provider: Mar Daring, PA-C   Chief Complaint  Patient presents with  . Annual Exam   Subjective:     Annual physical exam Deborah Cole is a 53 y.o. female who presents today for health maintenance and complete physical. She feels fairly well. She reports exercising some. She reports she is sleeping poorly.  Pt reports she is able to fall asleep but wakes up often and has a hard time falling back asleep.  Pt reports it happens almost every night. Has not really tried anything long term, has taken a few melatonin.  -----------------------------------------------------------------   Review of Systems  Constitutional: Negative.   HENT: Negative.   Eyes: Negative.   Respiratory: Negative.   Cardiovascular: Negative.   Gastrointestinal: Negative.   Endocrine: Negative.   Genitourinary: Negative.   Musculoskeletal: Negative.   Skin: Negative.   Allergic/Immunologic: Negative.   Neurological: Negative.   Hematological: Negative.   Psychiatric/Behavioral: Positive for sleep disturbance. Negative for agitation, behavioral problems, confusion, decreased concentration, dysphoric mood, hallucinations, self-injury and suicidal ideas. The patient is not nervous/anxious and is not hyperactive.     Social History      She  reports that she has never smoked. She has never used smokeless tobacco. She reports that she does not drink alcohol or use drugs.       Social History   Socioeconomic History  . Marital status: Married    Spouse name: Not on file  . Number of children: Not on file  . Years of education: Not on file  . Highest education level: Not on file  Occupational History  . Not on file  Social Needs  . Financial resource strain: Not on file  . Food insecurity    Worry: Not on file    Inability: Not on file  . Transportation needs    Medical:  Not on file    Non-medical: Not on file  Tobacco Use  . Smoking status: Never Smoker  . Smokeless tobacco: Never Used  Substance and Sexual Activity  . Alcohol use: No  . Drug use: No  . Sexual activity: Yes  Lifestyle  . Physical activity    Days per week: Not on file    Minutes per session: Not on file  . Stress: Not on file  Relationships  . Social Herbalist on phone: Not on file    Gets together: Not on file    Attends religious service: Not on file    Active member of club or organization: Not on file    Attends meetings of clubs or organizations: Not on file    Relationship status: Not on file  Other Topics Concern  . Not on file  Social History Narrative  . Not on file    Past Medical History:  Diagnosis Date  . Anxiety   . Cancer ([place])    melanoma  . Vitamin D deficiency      Patient Active Problem List   Diagnosis Date Noted  . Acute stress disorder 01/05/2016  . Anxiety 01/05/2016  . BMI 36.0-36.9,adult 01/05/2016  . Excess, menstruation 01/05/2016  . Adnexal pain 01/05/2016  . Depression 02/18/2015  . Avitaminosis D 07/08/2009  . Elevation of level of transaminase or lactic acid dehydrogenase (LDH) 01/09/2009  . Melanoma of skin ([place]) 11/27/2008    Past  Surgical History:  Procedure Laterality Date  . History of Melanoma excision  2005   Malignant  . S/P bunionectomy  1984  . TUBAL LIGATION      Family History        Family Status  Relation Name Status  . Mother  Alive  . Father  Alive  . Sister  Alive  . Daughter  Alive  . Son  Alive  . Son  Alive  . MGM  (Not Specified)  . Neg Hx  (Not Specified)        Her family history includes Cancer (age of onset: 23) in her maternal grandmother. There is no history of Breast cancer.      No Known Allergies   Current Outpatient Medications:  .  cholecalciferol (VITAMIN D) 1000 units tablet, Take by mouth., Disp: , Rfl:  .  desogestrel-ethinyl estradiol (BEKYREE) 0.15-0.02/0.01  MG (21/5) tablet, Take 1 tablet by mouth daily., Disp: 1 Package, Rfl: 11 .  Multiple Vitamin tablet, Take by mouth., Disp: , Rfl:  .  Psyllium 28.3 % POWD, Take by mouth. Reported on 01/06/2016, Disp: , Rfl:    Patient Care Team: Mar Daring, PA-C as PCP - General (Family Medicine)    Objective:    Vitals: BP 120/79 (BP Location: Right Arm, Patient Position: Sitting, Cuff Size: Normal)   Pulse 85   Temp 98.5 F (36.9 C) (Oral)   Ht 5\' 1"  (1.549 m)   Wt 200 lb (90.7 kg)   LMP 02/28/2019   BMI 37.79 kg/m    Vitals:   03/05/19 1455  BP: 120/79  Pulse: 85  Temp: 98.5 F (36.9 C)  TempSrc: Oral  Weight: 200 lb (90.7 kg)  Height: 5\' 1"  (1.549 m)     Physical Exam Vitals signs reviewed.  Constitutional:      General: She is not in acute distress.    Appearance: Normal appearance. She is well-developed. She is obese. She is not ill-appearing or diaphoretic.  HENT:     Head: Normocephalic and atraumatic.     Right Ear: Tympanic membrane, ear canal and external ear normal.     Left Ear: Tympanic membrane, ear canal and external ear normal.     Nose: Nose normal.     Mouth/Throat:     Mouth: Mucous membranes are moist.     Pharynx: No oropharyngeal exudate.  Eyes:     General: No scleral icterus.       Right eye: No discharge.        Left eye: No discharge.     Extraocular Movements: Extraocular movements intact.     Conjunctiva/sclera: Conjunctivae normal.     Pupils: Pupils are equal, round, and reactive to light.  Neck:     Musculoskeletal: Normal range of motion and neck supple.     Thyroid: No thyromegaly.     Vascular: No carotid bruit or JVD.     Trachea: No tracheal deviation.  Cardiovascular:     Rate and Rhythm: Normal rate and regular rhythm.     Pulses: Normal pulses.     Heart sounds: Normal heart sounds. No murmur. No friction rub. No gallop.   Pulmonary:     Effort: Pulmonary effort is normal. No respiratory distress.     Breath sounds:  Normal breath sounds. No wheezing or rales.  Chest:     Chest wall: No tenderness.  Abdominal:     General: Abdomen is flat. Bowel sounds are normal. There is no  distension.     Palpations: Abdomen is soft. There is no mass.     Tenderness: There is no abdominal tenderness. There is no guarding or rebound.  Musculoskeletal: Normal range of motion.        General: No tenderness.     Right lower leg: No edema.     Left lower leg: No edema.  Lymphadenopathy:     Cervical: No cervical adenopathy.  Skin:    General: Skin is warm and dry.     Capillary Refill: Capillary refill takes less than 2 seconds.     Findings: No rash.  Neurological:     General: No focal deficit present.     Mental Status: She is alert and oriented to person, place, and time. Mental status is at baseline.     Cranial Nerves: No cranial nerve deficit.     Motor: No weakness.     Coordination: Coordination normal.     Gait: Gait normal.  Psychiatric:        Mood and Affect: Mood normal.        Behavior: Behavior normal.        Thought Content: Thought content normal.        Judgment: Judgment normal.      Depression Screen PHQ 2/9 Scores 03/05/2019 02/23/2018 02/15/2017 01/06/2016  PHQ - 2 Score 1 0 0 0  PHQ- 9 Score 3 2 0 -       Assessment & Plan:     Routine Health Maintenance and Physical Exam  Exercise Activities and Dietary recommendations Goals   None     Immunization History  Administered Date(s) Administered  . Influenza,inj,Quad PF,6+ Mos 09/13/2013  . Influenza-Unspecified 05/22/2018  . Td 01/13/1999  . Tdap 12/11/2008, 10/20/2010    Health Maintenance  Topic Date Due  . COLONOSCOPY  06/24/2018  . PAP SMEAR-Modifier  01/06/2019  . INFLUENZA VACCINE  03/17/2019  . MAMMOGRAM  03/31/2020  . TETANUS/TDAP  10/19/2020  . HIV Screening  Completed     Discussed health benefits of physical activity, and encouraged her to engage in regular exercise appropriate for her age and  condition.    1. Annual physical exam Normal physical exam today. Will check labs as below and f/u pending lab results. If labs are stable and WNL she will not need to have these rechecked for one year at her next annual physical exam. She is to call the office in the meantime if she has any acute issue, questions or concerns. - CBC w/Diff/Platelet - Comprehensive Metabolic Panel (CMET) - TSH - Lipid Profile - HgB A1c  2. Breast cancer screening Breast exam today was normal. There is no family history of breast cancer. She does perform regular self breast exams. Mammogram was ordered as below. Information for [facility] Breast clinic was given to patient so she may schedule her mammogram at her convenience. - MM 3D SCREEN BREAST BILATERAL; Future  3. Colon cancer screening No previous colonoscopy. No family history. Due for screening. Referral placed as below. - Ambulatory referral to Gastroenterology  4. Encounter for repeat prescription of oral contraceptives Stable. Diagnosis pulled for medication refill. Continue current medical treatment plan. - desogestrel-ethinyl estradiol (BEKYREE) 0.15-0.02/0.01 MG (21/5) tablet; Take 1 tablet by mouth daily.  Dispense: 1 Package; Refill: 11  5. Elevation of level of transaminase or lactic acid dehydrogenase (LDH) Diet controlled. Will check labs as below and f/u pending results. - Comprehensive Metabolic Panel (CMET) - Lipid Profile - HgB A1c  6. Class 2 obesity due to excess calories without serious comorbidity with body mass index (BMI) of 37.0 to 37.9 in adult Counseled patient on healthy lifestyle modifications including dieting and exercise.  - TSH - Lipid Profile - HgB A1c  7. Melanoma of skin ([place]) H/O this. S/P large excision on forearm. Followed by [facility].   --------------------------------------------------------------------    Mar Daring, PA-C  [facility]

## 2019-03-05 NOTE — Patient Instructions (Signed)
Health Maintenance, Female Adopting a healthy lifestyle and getting preventive care are important in promoting health and wellness. Ask your health care provider about:  The right schedule for you to have regular tests and exams.  Things you can do on your own to prevent diseases and keep yourself healthy. What should I know about diet, weight, and exercise? Eat a healthy diet   Eat a diet that includes plenty of vegetables, fruits, low-fat dairy products, and lean protein.  Do not eat a lot of foods that are high in solid fats, added sugars, or sodium. Maintain a healthy weight Body mass index (BMI) is used to identify weight problems. It estimates body fat based on height and weight. Your health care provider can help determine your BMI and help you achieve or maintain a healthy weight. Get regular exercise Get regular exercise. This is one of the most important things you can do for your health. Most adults should:  Exercise for at least 150 minutes each week. The exercise should increase your heart rate and make you sweat (moderate-intensity exercise).  Do strengthening exercises at least twice a week. This is in addition to the moderate-intensity exercise.  Spend less time sitting. Even light physical activity can be beneficial. Watch cholesterol and blood lipids Have your blood tested for lipids and cholesterol at 51 years of age, then have this test every 5 years. Have your cholesterol levels checked more often if:  Your lipid or cholesterol levels are high.  You are older than 51 years of age.  You are at high risk for heart disease. What should I know about cancer screening? Depending on your health history and family history, you may need to have cancer screening at various ages. This may include screening for:  Breast cancer.  Cervical cancer.  Colorectal cancer.  Skin cancer.  Lung cancer. What should I know about heart disease, diabetes, and high blood  pressure? Blood pressure and heart disease  High blood pressure causes heart disease and increases the risk of stroke. This is more likely to develop in people who have high blood pressure readings, are of African descent, or are overweight.  Have your blood pressure checked: ? Every 3-5 years if you are 18-39 years of age. ? Every year if you are 40 years old or older. Diabetes Have regular diabetes screenings. This checks your fasting blood sugar level. Have the screening done:  Once every three years after age 40 if you are at a normal weight and have a low risk for diabetes.  More often and at a younger age if you are overweight or have a high risk for diabetes. What should I know about preventing infection? Hepatitis B If you have a higher risk for hepatitis B, you should be screened for this virus. Talk with your health care provider to find out if you are at risk for hepatitis B infection. Hepatitis C Testing is recommended for:  Everyone born from 1945 through 1965.  Anyone with known risk factors for hepatitis C. Sexually transmitted infections (STIs)  Get screened for STIs, including gonorrhea and chlamydia, if: ? You are sexually active and are younger than 51 years of age. ? You are older than 51 years of age and your health care provider tells you that you are at risk for this type of infection. ? Your sexual activity has changed since you were last screened, and you are at increased risk for chlamydia or gonorrhea. Ask your health care provider if   you are at risk.  Ask your health care provider about whether you are at high risk for HIV. Your health care provider may recommend a prescription medicine to help prevent HIV infection. If you choose to take medicine to prevent HIV, you should first get tested for HIV. You should then be tested every 3 months for as long as you are taking the medicine. Pregnancy  If you are about to stop having your period (premenopausal) and  you may become pregnant, seek counseling before you get pregnant.  Take 400 to 800 micrograms (mcg) of folic acid every day if you become pregnant.  Ask for birth control (contraception) if you want to prevent pregnancy. Osteoporosis and menopause Osteoporosis is a disease in which the bones lose minerals and strength with aging. This can result in bone fractures. If you are 65 years old or older, or if you are at risk for osteoporosis and fractures, ask your health care provider if you should:  Be screened for bone loss.  Take a calcium or vitamin D supplement to lower your risk of fractures.  Be given hormone replacement therapy (HRT) to treat symptoms of menopause. Follow these instructions at home: Lifestyle  Do not use any products that contain nicotine or tobacco, such as cigarettes, e-cigarettes, and chewing tobacco. If you need help quitting, ask your health care provider.  Do not use street drugs.  Do not share needles.  Ask your health care provider for help if you need support or information about quitting drugs. Alcohol use  Do not drink alcohol if: ? Your health care provider tells you not to drink. ? You are pregnant, may be pregnant, or are planning to become pregnant.  If you drink alcohol: ? Limit how much you use to 0-1 drink a day. ? Limit intake if you are breastfeeding.  Be aware of how much alcohol is in your drink. In the [place] one drink equals one 12 oz bottle of beer (355 mL), one 5 oz glass of wine (148 mL), or one 1 oz glass of hard liquor (44 mL). General instructions  Schedule regular health, dental, and eye exams.  Stay current with your vaccines.  Tell your health care provider if: ? You often feel depressed. ? You have ever been abused or do not feel safe at home. Summary  Adopting a healthy lifestyle and getting preventive care are important in promoting health and wellness.  Follow your health care provider's instructions about healthy  diet, exercising, and getting tested or screened for diseases.  Follow your health care provider's instructions on monitoring your cholesterol and blood pressure. This information is not intended to replace advice given to you by your health care provider. Make sure you discuss any questions you have with your health care provider. Document Released: 02/15/2011 Document Revised: 07/26/2018 Document Reviewed: 07/26/2018 Elsevier Patient Education  2020 Elsevier Inc.  

## 2019-03-08 ENCOUNTER — Telehealth: Payer: Self-pay

## 2019-03-08 LAB — CBC WITH DIFFERENTIAL/PLATELET
Basophils Absolute: 0 10*3/uL (ref 0.0–0.2)
Basos: 0 %
EOS (ABSOLUTE): 0.1 10*3/uL (ref 0.0–0.4)
Eos: 1 %
Hematocrit: 40.7 % (ref 34.0–46.6)
Hemoglobin: 13.7 g/dL (ref 11.1–15.9)
Immature Grans (Abs): 0 10*3/uL (ref 0.0–0.1)
Immature Granulocytes: 0 %
Lymphocytes Absolute: 1.8 10*3/uL (ref 0.7–3.1)
Lymphs: 35 %
MCH: 30.5 pg (ref 26.6–33.0)
MCHC: 33.7 g/dL (ref 31.5–35.7)
MCV: 91 fL (ref 79–97)
Monocytes Absolute: 0.3 10*3/uL (ref 0.1–0.9)
Monocytes: 7 %
Neutrophils Absolute: 2.8 10*3/uL (ref 1.4–7.0)
Neutrophils: 57 %
Platelets: 244 10*3/uL (ref 150–450)
RBC: 4.49 x10E6/uL (ref 3.77–5.28)
RDW: 12.5 % (ref 11.7–15.4)
WBC: 5 10*3/uL (ref 3.4–10.8)

## 2019-03-08 LAB — COMPREHENSIVE METABOLIC PANEL
ALT: 24 IU/L (ref 0–32)
AST: 19 IU/L (ref 0–40)
Albumin/Globulin Ratio: 1.9 (ref 1.2–2.2)
Albumin: 4.4 g/dL (ref 3.8–4.8)
Alkaline Phosphatase: 72 IU/L (ref 39–117)
BUN/Creatinine Ratio: 11 (ref 9–23)
BUN: 10 mg/dL (ref 6–24)
Bilirubin Total: 0.7 mg/dL (ref 0.0–1.2)
CO2: 18 mmol/L — ABNORMAL LOW (ref 20–29)
Calcium: 9.6 mg/dL (ref 8.7–10.2)
Chloride: 106 mmol/L (ref 96–106)
Creatinine, Ser: 0.93 mg/dL (ref 0.57–1.00)
GFR calc Af Amer: 83 mL/min/{1.73_m2} (ref >59)
GFR calc non Af Amer: 72 mL/min/{1.73_m2} (ref >59)
Globulin, Total: 2.3 g/dL (ref 1.5–4.5)
Glucose: 87 mg/dL (ref 65–99)
Potassium: 4.4 mmol/L (ref 3.5–5.2)
Sodium: 140 mmol/L (ref 134–144)
Total Protein: 6.7 g/dL (ref 6.0–8.5)

## 2019-03-08 LAB — LIPID PANEL
Chol/HDL Ratio: 2.6 ratio (ref 0.0–4.4)
Cholesterol, Total: 167 mg/dL (ref 100–199)
HDL: 65 mg/dL (ref >39)
LDL Calculated: 85 mg/dL (ref 0–99)
Triglycerides: 86 mg/dL (ref 0–149)
VLDL Cholesterol Cal: 17 mg/dL (ref 5–40)

## 2019-03-08 LAB — TSH: TSH: 4.13 u[IU]/mL (ref 0.450–4.500)

## 2019-03-08 LAB — HEMOGLOBIN A1C
Est. average glucose Bld gHb Est-mCnc: 97 mg/dL
Hgb A1c MFr Bld: 5 % (ref 4.8–5.6)

## 2019-03-08 NOTE — Telephone Encounter (Signed)
-----   Message from Mar Daring, PA-C sent at 03/08/2019  9:13 AM EDT ----- All labs are within normal limits and stable.  Thanks! -JB

## 2019-03-08 NOTE — Telephone Encounter (Signed)
LMTCB 03/08/2019  Thanks,   -Mickel Baas

## 2019-03-12 NOTE — Telephone Encounter (Signed)
Pt advised.   Thanks,   -Alashia Brownfield  

## 2019-04-09 ENCOUNTER — Encounter: Payer: Self-pay | Admitting: *Deleted

## 2019-04-26 ENCOUNTER — Other Ambulatory Visit: Payer: Self-pay | Admitting: Physician Assistant

## 2019-04-26 DIAGNOSIS — Z3041 Encounter for surveillance of contraceptive pills: Secondary | ICD-10-CM

## 2019-06-19 ENCOUNTER — Ambulatory Visit
Admission: RE | Admit: 2019-06-19 | Discharge: 2019-06-19 | Disposition: A | Discharge status: Home / Self Care | Payer: BC Managed Care – PPO | Source: Ambulatory Visit | Attending: Physician Assistant | Admitting: Physician Assistant

## 2019-06-19 DIAGNOSIS — Z1239 Encounter for other screening for malignant neoplasm of breast: Secondary | ICD-10-CM

## 2019-06-19 DIAGNOSIS — Z1231 Encounter for screening mammogram for malignant neoplasm of breast: Secondary | ICD-10-CM | POA: Insufficient documentation

## 2020-02-07 ENCOUNTER — Other Ambulatory Visit (INDEPENDENT_AMBULATORY_CARE_PROVIDER_SITE_OTHER): Payer: Self-pay

## 2020-02-07 ENCOUNTER — Telehealth (INDEPENDENT_AMBULATORY_CARE_PROVIDER_SITE_OTHER): Payer: Self-pay | Admitting: Gastroenterology

## 2020-02-07 DIAGNOSIS — Z1211 Encounter for screening for malignant neoplasm of colon: Secondary | ICD-10-CM

## 2020-02-07 NOTE — Progress Notes (Signed)
Gastroenterology Pre-Procedure Review  Request Date: Wed 03/05/20 Requesting Physician: Dr. Bonna Gains  PATIENT REVIEW QUESTIONS: The patient responded to the following health history questions as indicated:    1. Are you having any GI issues? no 2. Do you have a personal history of Polyps? no 3. Do you have a family history of Colon Cancer or Polyps? no 4. Diabetes Mellitus? no 5. Joint replacements in the past 12 months?no 6. Major health problems in the past 3 months?no 7. Any artificial heart valves, MVP, or defibrillator?no    MEDICATIONS & ALLERGIES:    Patient reports the following regarding taking any anticoagulation/antiplatelet therapy:   Plavix, Coumadin, Eliquis, Xarelto, Lovenox, Pradaxa, Brilinta, or Effient? no Aspirin? no  Patient confirms/reports the following medications:  Current Outpatient Medications  Medication Sig Dispense Refill  . Multiple Vitamin tablet Take by mouth.    Marland Kitchen VIORELE 0.15-0.02/0.01 MG (21/5) tablet TAKE 1 TABLET BY MOUTH DAILY 84 tablet 4  . cholecalciferol (VITAMIN D) 1000 units tablet Take by mouth. (Patient not taking: Reported on 02/07/2020)    . Psyllium 28.3 % POWD Take by mouth. Reported on 01/06/2016 (Patient not taking: Reported on 02/07/2020)     No current facility-administered medications for this visit.    Patient confirms/reports the following allergies:  No Known Allergies  No orders of the defined types were placed in this encounter.   AUTHORIZATION INFORMATION Primary Insurance: 1D#: Group #:  Secondary Insurance: 1D#: Group #:  SCHEDULE INFORMATION: Date: Wed 03/05/20 Time: Location:[facility]

## 2020-03-03 ENCOUNTER — Other Ambulatory Visit: Payer: Self-pay

## 2020-03-03 ENCOUNTER — Other Ambulatory Visit
Admission: RE | Admit: 2020-03-03 | Discharge: 2020-03-03 | Disposition: A | Discharge status: Home / Self Care | Payer: BC Managed Care – PPO | Source: Ambulatory Visit | Attending: Gastroenterology | Admitting: Gastroenterology

## 2020-03-03 DIAGNOSIS — Z01812 Encounter for preprocedural laboratory examination: Secondary | ICD-10-CM | POA: Insufficient documentation

## 2020-03-03 DIAGNOSIS — Z20822 Contact with and (suspected) exposure to covid-19: Secondary | ICD-10-CM | POA: Diagnosis not present

## 2020-03-03 LAB — SARS CORONAVIRUS 2 (TAT 6-24 HRS): SARS Coronavirus 2: NEGATIVE

## 2020-03-05 ENCOUNTER — Encounter
Admission: RE | Disposition: A | Discharge status: Home / Self Care | Payer: Self-pay | Source: Home / Self Care | Attending: Gastroenterology

## 2020-03-05 ENCOUNTER — Ambulatory Visit: Payer: BC Managed Care – PPO | Admitting: Certified Registered Nurse Anesthetist

## 2020-03-05 ENCOUNTER — Encounter: Payer: Self-pay | Admitting: Gastroenterology

## 2020-03-05 ENCOUNTER — Other Ambulatory Visit: Payer: Self-pay

## 2020-03-05 ENCOUNTER — Ambulatory Visit
Admission: RE | Admit: 2020-03-05 | Discharge: 2020-03-05 | Disposition: A | Discharge status: Home / Self Care | Payer: BC Managed Care – PPO | Attending: Gastroenterology | Admitting: Gastroenterology

## 2020-03-05 DIAGNOSIS — Z79899 Other long term (current) drug therapy: Secondary | ICD-10-CM | POA: Insufficient documentation

## 2020-03-05 DIAGNOSIS — Z1211 Encounter for screening for malignant neoplasm of colon: Secondary | ICD-10-CM | POA: Insufficient documentation

## 2020-03-05 DIAGNOSIS — Z793 Long term (current) use of hormonal contraceptives: Secondary | ICD-10-CM | POA: Diagnosis not present

## 2020-03-05 DIAGNOSIS — Z85820 Personal history of malignant melanoma of skin: Secondary | ICD-10-CM | POA: Insufficient documentation

## 2020-03-05 DIAGNOSIS — K635 Polyp of colon: Secondary | ICD-10-CM | POA: Diagnosis not present

## 2020-03-05 DIAGNOSIS — D123 Benign neoplasm of transverse colon: Secondary | ICD-10-CM | POA: Diagnosis not present

## 2020-03-05 HISTORY — PX: COLONOSCOPY WITH PROPOFOL: SHX5780

## 2020-03-05 SURGERY — COLONOSCOPY WITH PROPOFOL
Anesthesia: General

## 2020-03-05 MED ORDER — PROPOFOL 10 MG/ML IV BOLUS
INTRAVENOUS | Status: AC
  Filled 2020-03-05: qty 20

## 2020-03-05 MED ORDER — PROPOFOL 500 MG/50ML IV EMUL
INTRAVENOUS | Status: DC | PRN
  Administered 2020-03-05: 150 ug/kg/min via INTRAVENOUS

## 2020-03-05 MED ORDER — PROPOFOL 500 MG/50ML IV EMUL
INTRAVENOUS | Status: AC
  Filled 2020-03-05: qty 50

## 2020-03-05 MED ORDER — LIDOCAINE HCL (CARDIAC) PF 100 MG/5ML IV SOSY
PREFILLED_SYRINGE | INTRAVENOUS | Status: DC | PRN
  Administered 2020-03-05: 50 mg via INTRAVENOUS

## 2020-03-05 MED ORDER — LIDOCAINE HCL (PF) 2 % IJ SOLN
INTRAMUSCULAR | Status: AC
  Filled 2020-03-05: qty 5

## 2020-03-05 MED ORDER — PROPOFOL 10 MG/ML IV BOLUS
INTRAVENOUS | Status: DC | PRN
  Administered 2020-03-05: 20 mg via INTRAVENOUS
  Administered 2020-03-05: 70 mg via INTRAVENOUS
  Administered 2020-03-05: 20 mg via INTRAVENOUS
  Administered 2020-03-05: 30 mg via INTRAVENOUS

## 2020-03-05 MED ORDER — SODIUM CHLORIDE 0.9 % IV SOLN: INTRAVENOUS | Status: DC

## 2020-03-05 NOTE — Op Note (Signed)
[facility]astroenterology Patient Name: Deborah Cole Procedure Date: 03/05/2020 9:41 AM MRN: [id number] Account #: [id number] Date of Birth: [birth year] Admit Type: Outpatient Age: 54 Room: [facility] ENDO ROOM 2 Gender: Female Note Status: Finalized Procedure:             Colonoscopy Indications:           Screening for colorectal malignant neoplasm Providers:             Tiphani Mells B. Bonna Gains MD, MD Referring MD:          Mar Daring (Referring MD) Medicines:             Monitored Anesthesia Care Complications:         No immediate complications. Procedure:             Pre-Anesthesia Assessment:                        - ASA Grade Assessment: II - A patient with mild                         systemic disease.                        - Prior to the procedure, a History and Physical was                         performed, and patient medications, allergies and                         sensitivities were reviewed. The patient's tolerance                         of previous anesthesia was reviewed.                        - The risks and benefits of the procedure and the                         sedation options and risks were discussed with the                         patient. All questions were answered and informed                         consent was obtained.                        - Patient identification and proposed procedure were                         verified prior to the procedure by the physician, the                         nurse, the anesthesiologist, the anesthetist and the                         technician. The procedure was verified in the                         procedure room.  After obtaining informed consent, the colonoscope was                         passed under direct vision. Throughout the procedure,                         the patient's blood pressure, pulse, and oxygen                         saturations were  monitored continuously. The                         Colonoscope was introduced through the anus and                         advanced to the the cecum, identified by appendiceal                         orifice and ileocecal valve. The colonoscopy was                         performed with ease. The patient tolerated the                         procedure well. The quality of the bowel preparation                         was good. Findings:      The perianal and digital rectal examinations were normal.      A 4 mm polyp was found in the transverse colon. The polyp was sessile.       The polyp was removed with a jumbo cold forceps. Resection and retrieval       were complete.      A 8 mm polyp was found in the transverse colon. The polyp was sessile.       The polyp was removed with a cold snare. Resection and retrieval were       complete.      The exam was otherwise without abnormality.      The rectum, sigmoid colon, descending colon, transverse colon, ascending       colon and cecum appeared normal.      The retroflexed view of the distal rectum and anal verge was normal and       showed no anal or rectal abnormalities. Impression:            - One 4 mm polyp in the transverse colon, removed with                         a jumbo cold forceps. Resected and retrieved.                        - One 8 mm polyp in the transverse colon, removed with                         a cold snare. Resected and retrieved.                        - The examination was otherwise normal.                        -  The rectum, sigmoid colon, descending colon,                         transverse colon, ascending colon and cecum are normal.                        - The distal rectum and anal verge are normal on                         retroflexion view. Recommendation:        - Await pathology results.                        - Discharge patient to home (with escort).                        - Advance diet as  tolerated.                        - Continue present medications.                        - Repeat colonoscopy in 5 years.                        - The findings and recommendations were discussed with                         the patient.                        - The findings and recommendations were discussed with                         the patient's family.                        - Return to primary care physician as previously                         scheduled. Procedure Code(s):     --- Professional ---                        [phone], Colonoscopy, flexible; with removal of                         tumor(s), polyp(s), or other lesion(s) by snare                         technique                        [id number], 37, Colonoscopy, flexible; with biopsy, single                         or multiple Diagnosis Code(s):     --- Professional ---                        Z12.11, Encounter for screening for malignant neoplasm  of colon                        K63.5, Polyp of colon CPT copyright 2019 American Medical Association. All rights reserved. The codes documented in this report are preliminary and upon coder review may  be revised to meet current compliance requirements.  Vonda Antigua, MD Margretta Sidle B. Bonna Gains MD, MD 03/05/2020 10:25:18 AM This report has been signed electronically. Number of Addenda: 0 Note Initiated On: 03/05/2020 9:41 AM Scope Withdrawal Time: 0 hours 17 minutes 58 seconds  Total Procedure Duration: 0 hours 26 minutes 19 seconds  Estimated Blood Loss:  Estimated blood loss: none.      [facility]

## 2020-03-05 NOTE — Transfer of Care (Signed)
Immediate Anesthesia Transfer of Care Note  Patient: Deborah Cole  Procedure(s) Performed: COLONOSCOPY WITH PROPOFOL (N/A )  Patient Location: Endoscopy Unit  Anesthesia Type:General  Level of Consciousness: drowsy and patient cooperative  Airway & Oxygen Therapy: Patient Spontanous Breathing  Post-op Assessment: Report given to RN and Post -op Vital signs reviewed and stable  Post vital signs: Reviewed and stable  Last Vitals:  Vitals Value Taken Time  BP 109/65 03/05/20 1026  Temp 36.4 C 03/05/20 1024  Pulse 73 03/05/20 1026  Resp 14 03/05/20 1026  SpO2 98 % 03/05/20 1026  Vitals shown include unvalidated device data.  Last Pain:  Vitals:   03/05/20 1024  TempSrc: Temporal  PainSc: Asleep         Complications: No complications documented.

## 2020-03-05 NOTE — Anesthesia Preprocedure Evaluation (Signed)
Anesthesia Evaluation  Patient identified by MRN, date of birth, ID band Patient awake    Reviewed: Allergy & Precautions, H&P , NPO status , Patient's Chart, lab work & pertinent test results, reviewed documented beta blocker date and time   Airway Mallampati: II   Neck ROM: full    Dental  (+) Poor Dentition   Pulmonary neg pulmonary ROS,    Pulmonary exam normal        Cardiovascular Exercise Tolerance: Good negative cardio ROS Normal cardiovascular exam Rhythm:regular Rate:Normal     Neuro/Psych Anxiety Depression negative neurological ROS  negative psych ROS   GI/Hepatic negative GI ROS, Neg liver ROS,   Endo/Other  negative endocrine ROS  Renal/GU negative Renal ROS  negative genitourinary   Musculoskeletal   Abdominal   Peds  Hematology negative hematology ROS (+)   Anesthesia Other Findings Past Medical History: No date: Anxiety No date: Cancer [facility])     Comment:  melanoma No date: Vitamin D deficiency Past Surgical History: No date: CESAREAN SECTION 2005: History of Melanoma excision     Comment:  Malignant 1984: S/P bunionectomy No date: TUBAL LIGATION BMI    Body Mass Index: 36.21 kg/m     Reproductive/Obstetrics negative OB ROS                             Anesthesia Physical Anesthesia Plan  ASA: II  Anesthesia Plan: General   Post-op Pain Management:    Induction:   PONV Risk Score and Plan:   Airway Management Planned:   Additional Equipment:   Intra-op Plan:   Post-operative Plan:   Informed Consent: I have reviewed the patients History and Physical, chart, labs and discussed the procedure including the risks, benefits and alternatives for the proposed anesthesia with the patient or authorized representative who has indicated his/her understanding and acceptance.     Dental Advisory Given  Plan Discussed with: CRNA  Anesthesia Plan Comments:          Anesthesia Quick Evaluation

## 2020-03-06 ENCOUNTER — Encounter: Payer: Self-pay | Admitting: Gastroenterology

## 2020-03-06 LAB — SURGICAL PATHOLOGY

## 2020-03-06 NOTE — H&P (Signed)
Vonda Antigua, MD [address], [place], [place], [place], [address] [address], [place], [place], [place], [address] Phone: [phone]  Fax: [phone]  Primary Care Physician:  Mar Daring, PA-C   Pre-Procedure History & Physical: HPI:  SYDNEY HASTEN is a 54 y.o. female is here for a colonoscopy.   Past Medical History:  Diagnosis Date  . Anxiety   . Cancer ([place])    melanoma  . Vitamin D deficiency     Past Surgical History:  Procedure Laterality Date  . CESAREAN SECTION    . COLONOSCOPY WITH PROPOFOL N/A 03/05/2020   Procedure: COLONOSCOPY WITH PROPOFOL;  Surgeon: Virgel Manifold, MD;  Location: [facility] ENDOSCOPY;  Service: Endoscopy;  Laterality: N/A;  . History of Melanoma excision  2005   Malignant  . S/P bunionectomy  1984  . TUBAL LIGATION      Prior to Admission medications   Medication Sig Start Date End Date Taking? Authorizing Provider  Multiple Vitamin tablet Take by mouth.   Yes [provider]  VIORELE 0.15-0.02/0.01 MG (21/5) tablet TAKE 1 TABLET BY MOUTH DAILY 04/26/19  Yes Mar Daring, PA-C  cholecalciferol (VITAMIN D) 1000 units tablet Take by mouth. Patient not taking: Reported on 02/07/2020    [provider]  Psyllium 28.3 % POWD Take by mouth. Reported on 01/06/2016 Patient not taking: Reported on 02/07/2020    [provider]    Allergies as of 02/07/2020  . (No Known Allergies)    Family History  Problem Relation Age of Onset  . Cancer Maternal Grandmother 9       Lung cancer  . Breast cancer Neg Hx     Social History   Socioeconomic History  . Marital status: Married    Spouse name: Not on file  . Number of children: Not on file  . Years of education: Not on file  . Highest education level: Not on file  Occupational History  . Not on file  Tobacco Use  . Smoking status: Never Smoker  . Smokeless tobacco: Never Used  Vaping Use  . Vaping Use: Never used  Substance  and Sexual Activity  . Alcohol use: No  . Drug use: No  . Sexual activity: Yes  Other Topics Concern  . Not on file  Social History Narrative  . Not on file   Social Determinants of Health   Financial Resource Strain:   . Difficulty of Paying Living Expenses:   Food Insecurity:   . Worried About Charity fundraiser in the Last Year:   . Arboriculturist in the Last Year:   Transportation Needs:   . Film/video editor (Medical):   Marland Kitchen Lack of Transportation (Non-Medical):   Physical Activity:   . Days of Exercise per Week:   . Minutes of Exercise per Session:   Stress:   . Feeling of Stress :   Social Connections:   . Frequency of Communication with Friends and Family:   . Frequency of Social Gatherings with Friends and Family:   . Attends Religious Services:   . Active Member of Clubs or Organizations:   . Attends Archivist Meetings:   Marland Kitchen Marital Status:   Intimate Partner Violence:   . Fear of Current or Ex-Partner:   . Emotionally Abused:   Marland Kitchen Physically Abused:   . Sexually Abused:     Review of Systems: See HPI, otherwise negative ROS  Physical Exam: BP 99/74  Pulse 66   Temp 97.6 F (36.4 C) (Temporal)   Resp 14   Ht 5\' 2"  (1.575 m)   Wt 89.8 kg   SpO2 100%   BMI 36.21 kg/m  General:   Alert,  pleasant and cooperative in NAD Head:  Normocephalic and atraumatic. Neck:  Supple; no masses or thyromegaly. Lungs:  Clear throughout to auscultation, normal respiratory effort.    Heart:  +S1, +S2, Regular rate and rhythm, No edema. Abdomen:  Soft, nontender and nondistended. Normal bowel sounds, without guarding, and without rebound.   Neurologic:  Alert and  oriented x4;  grossly normal neurologically.  Impression/Plan: ZAMANTHA STREBEL is here for a colonoscopy to be performed for average risk screening.  Risks, benefits, limitations, and alternatives regarding  colonoscopy have been reviewed with the patient.  Questions have been answered.  All  parties agreeable.   Virgel Manifold, MD  03/06/2020, 10:05 AM

## 2020-03-10 ENCOUNTER — Encounter: Payer: Self-pay | Admitting: Physician Assistant

## 2020-03-10 ENCOUNTER — Other Ambulatory Visit (HOSPITAL_COMMUNITY)
Admission: RE | Admit: 2020-03-10 | Discharge: 2020-03-10 | Disposition: A | Discharge status: Home / Self Care | Payer: BC Managed Care – PPO | Source: Ambulatory Visit | Attending: Physician Assistant | Admitting: Physician Assistant

## 2020-03-10 ENCOUNTER — Ambulatory Visit (INDEPENDENT_AMBULATORY_CARE_PROVIDER_SITE_OTHER): Payer: BC Managed Care – PPO | Admitting: Physician Assistant

## 2020-03-10 ENCOUNTER — Other Ambulatory Visit: Payer: Self-pay

## 2020-03-10 VITALS — BP 105/75 | HR 77 | Temp 97.1°F | Resp 16 | Ht 62.0 in | Wt 196.8 lb

## 2020-03-10 DIAGNOSIS — Z124 Encounter for screening for malignant neoplasm of cervix: Secondary | ICD-10-CM | POA: Insufficient documentation

## 2020-03-10 DIAGNOSIS — Z1239 Encounter for other screening for malignant neoplasm of breast: Secondary | ICD-10-CM | POA: Diagnosis not present

## 2020-03-10 DIAGNOSIS — E559 Vitamin D deficiency, unspecified: Secondary | ICD-10-CM

## 2020-03-10 DIAGNOSIS — Z0000 Encounter for general adult medical examination without abnormal findings: Secondary | ICD-10-CM | POA: Diagnosis not present

## 2020-03-10 DIAGNOSIS — Z1159 Encounter for screening for other viral diseases: Secondary | ICD-10-CM

## 2020-03-10 DIAGNOSIS — Z6836 Body mass index (BMI) 36.0-36.9, adult: Secondary | ICD-10-CM

## 2020-03-10 NOTE — Patient Instructions (Signed)
Health Maintenance, Female Adopting a healthy lifestyle and getting preventive care are important in promoting health and wellness. Ask your health care provider about:  The right schedule for you to have regular tests and exams.  Things you can do on your own to prevent diseases and keep yourself healthy. What should I know about diet, weight, and exercise? Eat a healthy diet   Eat a diet that includes plenty of vegetables, fruits, low-fat dairy products, and lean protein.  Do not eat a lot of foods that are high in solid fats, added sugars, or sodium. Maintain a healthy weight Body mass index (BMI) is used to identify weight problems. It estimates body fat based on height and weight. Your health care provider can help determine your BMI and help you achieve or maintain a healthy weight. Get regular exercise Get regular exercise. This is one of the most important things you can do for your health. Most adults should:  Exercise for at least 150 minutes each week. The exercise should increase your heart rate and make you sweat (moderate-intensity exercise).  Do strengthening exercises at least twice a week. This is in addition to the moderate-intensity exercise.  Spend less time sitting. Even light physical activity can be beneficial. Watch cholesterol and blood lipids Have your blood tested for lipids and cholesterol at 52 years of age, then have this test every 5 years. Have your cholesterol levels checked more often if:  Your lipid or cholesterol levels are high.  You are older than 52 years of age.  You are at high risk for heart disease. What should I know about cancer screening? Depending on your health history and family history, you may need to have cancer screening at various ages. This may include screening for:  Breast cancer.  Cervical cancer.  Colorectal cancer.  Skin cancer.  Lung cancer. What should I know about heart disease, diabetes, and high blood  pressure? Blood pressure and heart disease  High blood pressure causes heart disease and increases the risk of stroke. This is more likely to develop in people who have high blood pressure readings, are of African descent, or are overweight.  Have your blood pressure checked: ? Every 3-5 years if you are 18-39 years of age. ? Every year if you are 40 years old or older. Diabetes Have regular diabetes screenings. This checks your fasting blood sugar level. Have the screening done:  Once every three years after age 40 if you are at a normal weight and have a low risk for diabetes.  More often and at a younger age if you are overweight or have a high risk for diabetes. What should I know about preventing infection? Hepatitis B If you have a higher risk for hepatitis B, you should be screened for this virus. Talk with your health care provider to find out if you are at risk for hepatitis B infection. Hepatitis C Testing is recommended for:  Everyone born from 1945 through 1965.  Anyone with known risk factors for hepatitis C. Sexually transmitted infections (STIs)  Get screened for STIs, including gonorrhea and chlamydia, if: ? You are sexually active and are younger than 52 years of age. ? You are older than 52 years of age and your health care provider tells you that you are at risk for this type of infection. ? Your sexual activity has changed since you were last screened, and you are at increased risk for chlamydia or gonorrhea. Ask your health care provider if   you are at risk.  Ask your health care provider about whether you are at high risk for HIV. Your health care provider may recommend a prescription medicine to help prevent HIV infection. If you choose to take medicine to prevent HIV, you should first get tested for HIV. You should then be tested every 3 months for as long as you are taking the medicine. Pregnancy  If you are about to stop having your period (premenopausal) and  you may become pregnant, seek counseling before you get pregnant.  Take 400 to 800 micrograms (mcg) of folic acid every day if you become pregnant.  Ask for birth control (contraception) if you want to prevent pregnancy. Osteoporosis and menopause Osteoporosis is a disease in which the bones lose minerals and strength with aging. This can result in bone fractures. If you are 65 years old or older, or if you are at risk for osteoporosis and fractures, ask your health care provider if you should:  Be screened for bone loss.  Take a calcium or vitamin D supplement to lower your risk of fractures.  Be given hormone replacement therapy (HRT) to treat symptoms of menopause. Follow these instructions at home: Lifestyle  Do not use any products that contain nicotine or tobacco, such as cigarettes, e-cigarettes, and chewing tobacco. If you need help quitting, ask your health care provider.  Do not use street drugs.  Do not share needles.  Ask your health care provider for help if you need support or information about quitting drugs. Alcohol use  Do not drink alcohol if: ? Your health care provider tells you not to drink. ? You are pregnant, may be pregnant, or are planning to become pregnant.  If you drink alcohol: ? Limit how much you use to 0-1 drink a day. ? Limit intake if you are breastfeeding.  Be aware of how much alcohol is in your drink. In the [place] one drink equals one 12 oz bottle of beer (355 mL), one 5 oz glass of wine (148 mL), or one 1 oz glass of hard liquor (44 mL). General instructions  Schedule regular health, dental, and eye exams.  Stay current with your vaccines.  Tell your health care provider if: ? You often feel depressed. ? You have ever been abused or do not feel safe at home. Summary  Adopting a healthy lifestyle and getting preventive care are important in promoting health and wellness.  Follow your health care provider's instructions about healthy  diet, exercising, and getting tested or screened for diseases.  Follow your health care provider's instructions on monitoring your cholesterol and blood pressure. This information is not intended to replace advice given to you by your health care provider. Make sure you discuss any questions you have with your health care provider. Document Revised: 07/26/2018 Document Reviewed: 07/26/2018 Elsevier Patient Education  2020 Elsevier Inc.  

## 2020-03-10 NOTE — Progress Notes (Signed)
Complete physical exam   Patient: Deborah Cole   DOB: 1965-10-23   54 y.o. Female  MRN: [id number] Visit Date: 03/10/2020  Today's healthcare provider: Mar Daring, PA-C   Chief Complaint  Patient presents with  . Annual Exam   Subjective    Deborah Cole is a 54 y.o. female who presents today for a complete physical exam.  She reports consuming a general diet. Home exercise routine includes walking 30 minutes, three times a week.. She generally feels fairly well. She reports sleeping well. She does not have additional problems to discuss today.  HPI  03/05/20- Colonoscopy. Repeat in 5 years. Pap:01/09/16-Pap is normal. HPV negative. GC/Chlamydia neg. Will repeat in 3 years. 06/21/19-Normal mammograms without signs of malignancy. Radiologist recommends screening mammograms in a year.  Past Medical History:  Diagnosis Date  . Anxiety   . Cancer ([place])    melanoma  . Vitamin D deficiency    Past Surgical History:  Procedure Laterality Date  . CESAREAN SECTION    . COLONOSCOPY WITH PROPOFOL N/A 03/05/2020   Procedure: COLONOSCOPY WITH PROPOFOL;  Surgeon: Virgel Manifold, MD;  Location: [facility] ENDOSCOPY;  Service: Endoscopy;  Laterality: N/A;  . History of Melanoma excision  2005   Malignant  . S/P bunionectomy  1984  . TUBAL LIGATION     Social History   Socioeconomic History  . Marital status: Married    Spouse name: Not on file  . Number of children: Not on file  . Years of education: Not on file  . Highest education level: Not on file  Occupational History  . Not on file  Tobacco Use  . Smoking status: Never Smoker  . Smokeless tobacco: Never Used  Vaping Use  . Vaping Use: Never used  Substance and Sexual Activity  . Alcohol use: No  . Drug use: No  . Sexual activity: Yes  Other Topics Concern  . Not on file  Social History Narrative  . Not on file   Social Determinants of Health   Financial Resource Strain:   . Difficulty of  Paying Living Expenses:   Food Insecurity:   . Worried About Charity fundraiser in the Last Year:   . Arboriculturist in the Last Year:   Transportation Needs:   . Film/video editor (Medical):   Marland Kitchen Lack of Transportation (Non-Medical):   Physical Activity:   . Days of Exercise per Week:   . Minutes of Exercise per Session:   Stress:   . Feeling of Stress :   Social Connections:   . Frequency of Communication with Friends and Family:   . Frequency of Social Gatherings with Friends and Family:   . Attends Religious Services:   . Active Member of Clubs or Organizations:   . Attends Archivist Meetings:   Marland Kitchen Marital Status:   Intimate Partner Violence:   . Fear of Current or Ex-Partner:   . Emotionally Abused:   Marland Kitchen Physically Abused:   . Sexually Abused:    Family Status  Relation Name Status  . Mother  Alive  . Father  Alive  . Sister  Alive  . Daughter  Alive  . Son  Alive  . Son  Alive  . MGM  (Not Specified)  . Neg Hx  (Not Specified)   Family History  Problem Relation Age of Onset  . Cancer Maternal Grandmother 46       Lung cancer  .  Breast cancer Neg Hx    No Known Allergies  Patient Care Team: Rubye Beach as PCP - General (Family Medicine)   Medications: Outpatient Medications Prior to Visit  Medication Sig  . Multiple Vitamin tablet Take by mouth.  Marland Kitchen VIORELE 0.15-0.02/0.01 MG (21/5) tablet TAKE 1 TABLET BY MOUTH DAILY  . cholecalciferol (VITAMIN D) 1000 units tablet Take by mouth.   . Psyllium 28.3 % POWD Take by mouth. Reported on 01/06/2016   No facility-administered medications prior to visit.    Review of Systems  Constitutional: Negative.   HENT: Negative.   Eyes: Negative.   Respiratory: Negative.   Cardiovascular: Negative.   Gastrointestinal: Negative.   Endocrine: Negative.   Genitourinary: Negative.   Musculoskeletal: Negative.   Skin: Negative.   Allergic/Immunologic: Negative.   Neurological: Negative.     Hematological: Negative.   Psychiatric/Behavioral: Negative.        Objective    BP 105/75 (BP Location: Left Arm, Patient Position: Sitting, Cuff Size: Large)   Pulse 77   Temp (!) 97.1 F (36.2 C) (Temporal)   Resp 16   Ht 5\' 2"  (1.575 m)   Wt 196 lb 12.8 oz (89.3 kg)   BMI 36.00 kg/m     Physical Exam Vitals reviewed.  Constitutional:      General: She is not in acute distress.    Appearance: Normal appearance. She is well-developed. She is obese. She is not ill-appearing or diaphoretic.  HENT:     Head: Normocephalic and atraumatic.     Right Ear: Hearing, tympanic membrane, ear canal and external ear normal.     Left Ear: Hearing, tympanic membrane, ear canal and external ear normal.     Nose: Nose normal.     Mouth/Throat:     Mouth: Mucous membranes are moist.     Pharynx: Oropharynx is clear. Uvula midline. No oropharyngeal exudate.  Eyes:     General: No scleral icterus.       Right eye: No discharge.        Left eye: No discharge.     Extraocular Movements: Extraocular movements intact.     Conjunctiva/sclera: Conjunctivae normal.     Pupils: Pupils are equal, round, and reactive to light.  Neck:     Thyroid: No thyromegaly.     Vascular: No carotid bruit or JVD.     Trachea: No tracheal deviation.  Cardiovascular:     Rate and Rhythm: Normal rate and regular rhythm.     Pulses: Normal pulses.     Heart sounds: Normal heart sounds. No murmur heard.  No friction rub. No gallop.   Pulmonary:     Effort: Pulmonary effort is normal. No respiratory distress.     Breath sounds: Normal breath sounds. No wheezing or rales.  Chest:     Chest wall: No tenderness.     Breasts: Breasts are symmetrical.        Right: No inverted nipple, mass, nipple discharge, skin change or tenderness.        Left: No inverted nipple, mass, nipple discharge, skin change or tenderness.  Abdominal:     General: Bowel sounds are normal. There is no distension.     Palpations:  Abdomen is soft. There is no mass.     Tenderness: There is no abdominal tenderness. There is no guarding or rebound.     Hernia: There is no hernia in the left inguinal area.  Genitourinary:    General: Normal vulva.  Exam position: Supine.     Labia:        Right: No rash, tenderness, lesion or injury.        Left: No rash, tenderness, lesion or injury.      Vagina: Normal. No signs of injury. No vaginal discharge, erythema, tenderness or bleeding.     Cervix: No cervical motion tenderness, discharge or friability.     Adnexa:        Right: No mass, tenderness or fullness.         Left: No mass, tenderness or fullness.       Rectum: Normal.  Musculoskeletal:        General: No tenderness. Normal range of motion.     Cervical back: Normal range of motion and neck supple.  Lymphadenopathy:     Cervical: No cervical adenopathy.  Skin:    General: Skin is warm and dry.     Capillary Refill: Capillary refill takes less than 2 seconds.     Findings: No rash.  Neurological:     General: No focal deficit present.     Mental Status: She is alert and oriented to person, place, and time. Mental status is at baseline.     Cranial Nerves: No cranial nerve deficit.     Coordination: Coordination normal.     Deep Tendon Reflexes: Reflexes are normal and symmetric.  Psychiatric:        Mood and Affect: Mood normal.        Behavior: Behavior normal.        Thought Content: Thought content normal.        Judgment: Judgment normal.     Last depression screening scores PHQ 2/9 Scores 03/10/2020 03/05/2019 02/23/2018  PHQ - 2 Score 0 1 0  PHQ- 9 Score - 3 2   Last fall risk screening Fall Risk  03/10/2020  Falls in the past year? 0  Number falls in past yr: 0  Injury with Fall? 0  Risk for fall due to : No Fall Risks  Follow up Falls evaluation completed   Last Audit-C alcohol use screening Alcohol Use Disorder Test (AUDIT) 03/10/2020  1. How often do you have a drink containing  alcohol? 1  2. How many drinks containing alcohol do you have on a typical day when you are drinking? 0  3. How often do you have six or more drinks on one occasion? 0  AUDIT-C Score 1  Alcohol Brief Interventions/Follow-up AUDIT Score <7 follow-up not indicated   A score of 3 or more in women, and 4 or more in men indicates increased risk for alcohol abuse, EXCEPT if all of the points are from question 1   No results found for any visits on 03/10/20.  Assessment & Plan    Routine Health Maintenance and Physical Exam  Exercise Activities and Dietary recommendations Goals   None     Immunization History  Administered Date(s) Administered  . Influenza,inj,Quad PF,6+ Mos 09/13/2013  . Influenza-Unspecified 05/22/2018  . Td 01/13/1999  . Tdap 12/11/2008, 10/20/2010    Health Maintenance  Topic Date Due  . Hepatitis C Screening  Never done  . COVID-19 Vaccine (1) Never done  . INFLUENZA VACCINE  03/16/2020  . TETANUS/TDAP  10/19/2020  . PAP SMEAR-Modifier  01/05/2021  . MAMMOGRAM  06/18/2021  . COLONOSCOPY  03/05/2025  . HIV Screening  Completed    Discussed health benefits of physical activity, and encouraged her to engage in regular exercise appropriate  for her age and condition.  1. Annual physical exam Normal physical exam today. Will check labs as below and f/u pending lab results. If labs are stable and WNL she will not need to have these rechecked for one year at her next annual physical exam. She is to call the office in the meantime if she has any acute issue, questions or concerns. - CBC with Differential/Platelet - Comprehensive metabolic panel - Hemoglobin A1c - TSH - Lipid panel  2. Encounter for breast cancer screening using non-mammogram modality Due in Nov 2021. Breast exam today was normal. There is no family history of breast cancer. She does perform regular self breast exams. Mammogram was ordered as below. Information for [facility] was  given to patient so she may schedule her mammogram at her convenience. - MM 3D SCREEN BREAST BILATERAL; Future  3. Cervical cancer screening Pap collected today. Will send as below and f/u pending results. - Cytology - PAP  4. Class 2 severe obesity due to excess calories with serious comorbidity and body mass index (BMI) of 36.0 to 36.9 in adult [facility]) Counseled patient on healthy lifestyle modifications including dieting and exercise.  Will check labs as below and f/u pending results. - CBC with Differential/Platelet - Comprehensive metabolic panel - Hemoglobin A1c - TSH - Lipid panel  5. Avitaminosis D Will check labs as below and f/u pending results. - CBC with Differential/Platelet  6. Encounter for hepatitis C screening test for low risk patient Will check labs as below and f/u pending results. - Hepatitis C antibody   No follow-ups on file.     Reynolds Bowl, PA-C, have reviewed all documentation for this visit. The documentation on 03/10/20 for the exam, diagnosis, procedures, and orders are all accurate and complete.   Rubye Beach  [facility] [phone] (phone) [phone] (fax)  [place]

## 2020-03-11 ENCOUNTER — Telehealth: Payer: Self-pay

## 2020-03-11 LAB — HEMOGLOBIN A1C
Est. average glucose Bld gHb Est-mCnc: 103 mg/dL
Hgb A1c MFr Bld: 5.2 % (ref 4.8–5.6)

## 2020-03-11 LAB — LIPID PANEL
Chol/HDL Ratio: 3.1 ratio (ref 0.0–4.4)
Cholesterol, Total: 195 mg/dL (ref 100–199)
HDL: 62 mg/dL (ref >39)
LDL Chol Calc (NIH): 113 mg/dL — ABNORMAL HIGH (ref 0–99)
Triglycerides: 113 mg/dL (ref 0–149)
VLDL Cholesterol Cal: 20 mg/dL (ref 5–40)

## 2020-03-11 LAB — CYTOLOGY - PAP
Adequacy: ABSENT
Comment: NEGATIVE
Diagnosis: NEGATIVE
High risk HPV: NEGATIVE

## 2020-03-11 LAB — CBC WITH DIFFERENTIAL/PLATELET
Basophils Absolute: 0 10*3/uL (ref 0.0–0.2)
Basos: 0 %
EOS (ABSOLUTE): 0 10*3/uL (ref 0.0–0.4)
Eos: 1 %
Hematocrit: 37.1 % (ref 34.0–46.6)
Hemoglobin: 12.7 g/dL (ref 11.1–15.9)
Immature Grans (Abs): 0 10*3/uL (ref 0.0–0.1)
Immature Granulocytes: 0 %
Lymphocytes Absolute: 1.8 10*3/uL (ref 0.7–3.1)
Lymphs: 34 %
MCH: 30.2 pg (ref 26.6–33.0)
MCHC: 34.2 g/dL (ref 31.5–35.7)
MCV: 88 fL (ref 79–97)
Monocytes Absolute: 0.3 10*3/uL (ref 0.1–0.9)
Monocytes: 7 %
Neutrophils Absolute: 3.1 10*3/uL (ref 1.4–7.0)
Neutrophils: 58 %
Platelets: 243 10*3/uL (ref 150–450)
RBC: 4.2 x10E6/uL (ref 3.77–5.28)
RDW: 12.2 % (ref 11.7–15.4)
WBC: 5.2 10*3/uL (ref 3.4–10.8)

## 2020-03-11 LAB — COMPREHENSIVE METABOLIC PANEL
ALT: 13 IU/L (ref 0–32)
AST: 11 IU/L (ref 0–40)
Albumin/Globulin Ratio: 1.8 (ref 1.2–2.2)
Albumin: 4.4 g/dL (ref 3.8–4.9)
Alkaline Phosphatase: 64 IU/L (ref 48–121)
BUN/Creatinine Ratio: 12 (ref 9–23)
BUN: 10 mg/dL (ref 6–24)
Bilirubin Total: 0.3 mg/dL (ref 0.0–1.2)
CO2: 19 mmol/L — ABNORMAL LOW (ref 20–29)
Calcium: 9.1 mg/dL (ref 8.7–10.2)
Chloride: 107 mmol/L — ABNORMAL HIGH (ref 96–106)
Creatinine, Ser: 0.82 mg/dL (ref 0.57–1.00)
GFR calc Af Amer: 96 mL/min/{1.73_m2} (ref >59)
GFR calc non Af Amer: 83 mL/min/{1.73_m2} (ref >59)
Globulin, Total: 2.4 g/dL (ref 1.5–4.5)
Glucose: 88 mg/dL (ref 65–99)
Potassium: 4.3 mmol/L (ref 3.5–5.2)
Sodium: 140 mmol/L (ref 134–144)
Total Protein: 6.8 g/dL (ref 6.0–8.5)

## 2020-03-11 LAB — HEPATITIS C ANTIBODY: Hep C Virus Ab: <0.1 s/co ratio (ref 0.0–0.9)

## 2020-03-11 LAB — TSH: TSH: 3.69 u[IU]/mL (ref 0.450–4.500)

## 2020-03-11 NOTE — Telephone Encounter (Signed)
-----   Message from Mar Daring, [place] sent at 03/11/2020 12:34 PM EDT ----- Pap is normal, HPV negative.  Will repeat in 5 years.

## 2020-03-11 NOTE — Telephone Encounter (Signed)
LMTCB 03/11/2020.  PEC please advise pt of lab results below.   Thanks,   -Mickel Baas

## 2020-03-11 NOTE — Telephone Encounter (Signed)
-----   Message from Mar Daring, [place] sent at 03/11/2020 11:51 AM EDT ----- Blood count is normal. Kidney and liver function are normal. Sodium, potassium and calcium are normal. Sugar/A1c is normal. Thyroid is normal. Cholesterol is normal, but has increased compared to last year. Continue working on healthy lifestyle modifications with limiting fatty foods, red meats and sugars. Hep C screen is negative. Pap still pending.

## 2020-03-12 NOTE — Telephone Encounter (Signed)
Result note read to patient, verbalizes understanding. ?

## 2020-03-12 NOTE — Telephone Encounter (Signed)
Patient called on both numbers listed, left VM to return the call to the office for results.

## 2020-03-13 NOTE — Anesthesia Postprocedure Evaluation (Signed)
Anesthesia Post Note  Patient: Deborah Cole  Procedure(s) Performed: COLONOSCOPY WITH PROPOFOL (N/A )  Patient location during evaluation: PACU Anesthesia Type: General Level of consciousness: awake and alert Pain management: pain level controlled Vital Signs Assessment: post-procedure vital signs reviewed and stable Respiratory status: spontaneous breathing, nonlabored ventilation, respiratory function stable and patient connected to nasal cannula oxygen Cardiovascular status: blood pressure returned to baseline and stable Postop Assessment: no apparent nausea or vomiting Anesthetic complications: no   No complications documented.   Last Vitals:  Vitals:   03/05/20 1044 03/05/20 1054  BP: 95/66 99/74  Pulse: 73 66  Resp: 17 14  Temp:    SpO2: 100% 100%    Last Pain:  Vitals:   03/05/20 1054  TempSrc:   PainSc: 0-No pain                 Molli Barrows

## 2020-05-19 ENCOUNTER — Other Ambulatory Visit: Payer: Self-pay | Admitting: Physician Assistant

## 2020-05-19 DIAGNOSIS — Z3041 Encounter for surveillance of contraceptive pills: Secondary | ICD-10-CM

## 2020-06-17 ENCOUNTER — Encounter: Payer: Self-pay | Admitting: Physician Assistant

## 2020-06-17 DIAGNOSIS — Z3041 Encounter for surveillance of contraceptive pills: Secondary | ICD-10-CM

## 2020-06-17 MED ORDER — DESOGESTREL-ETHINYL ESTRADIOL 0.15-0.02/0.01 MG (21/5) PO TABS: 1.0000 | ORAL_TABLET | Freq: Every day | ORAL | 4 refills | Status: DC

## 2020-07-23 ENCOUNTER — Ambulatory Visit
Admission: RE | Admit: 2020-07-23 | Discharge: 2020-07-23 | Disposition: A | Discharge status: Home / Self Care | Payer: BC Managed Care – PPO | Source: Ambulatory Visit | Attending: Physician Assistant | Admitting: Physician Assistant

## 2020-07-23 ENCOUNTER — Other Ambulatory Visit: Payer: Self-pay

## 2020-07-23 DIAGNOSIS — Z1239 Encounter for other screening for malignant neoplasm of breast: Secondary | ICD-10-CM

## 2020-07-23 DIAGNOSIS — Z1231 Encounter for screening mammogram for malignant neoplasm of breast: Secondary | ICD-10-CM | POA: Diagnosis not present

## 2021-03-13 ENCOUNTER — Encounter: Payer: Self-pay | Admitting: Physician Assistant

## 2021-03-30 ENCOUNTER — Encounter: Payer: Self-pay | Admitting: Family Medicine

## 2021-05-13 ENCOUNTER — Ambulatory Visit (INDEPENDENT_AMBULATORY_CARE_PROVIDER_SITE_OTHER): Payer: BC Managed Care – PPO | Admitting: Family Medicine

## 2021-05-13 ENCOUNTER — Encounter: Payer: Self-pay | Admitting: Family Medicine

## 2021-05-13 ENCOUNTER — Other Ambulatory Visit: Payer: Self-pay

## 2021-05-13 VITALS — BP 116/73 | HR 66 | Temp 98.2°F | Resp 15 | Ht 63.0 in | Wt 183.5 lb

## 2021-05-13 DIAGNOSIS — Z3041 Encounter for surveillance of contraceptive pills: Secondary | ICD-10-CM | POA: Insufficient documentation

## 2021-05-13 DIAGNOSIS — Z85820 Personal history of malignant melanoma of skin: Secondary | ICD-10-CM | POA: Insufficient documentation

## 2021-05-13 DIAGNOSIS — F411 Generalized anxiety disorder: Secondary | ICD-10-CM | POA: Diagnosis not present

## 2021-05-13 DIAGNOSIS — Z1231 Encounter for screening mammogram for malignant neoplasm of breast: Secondary | ICD-10-CM | POA: Diagnosis not present

## 2021-05-13 DIAGNOSIS — Z0000 Encounter for general adult medical examination without abnormal findings: Secondary | ICD-10-CM | POA: Diagnosis not present

## 2021-05-13 DIAGNOSIS — Z136 Encounter for screening for cardiovascular disorders: Secondary | ICD-10-CM

## 2021-05-13 DIAGNOSIS — F430 Acute stress reaction: Secondary | ICD-10-CM | POA: Insufficient documentation

## 2021-05-13 DIAGNOSIS — E66811 Obesity, class 1: Secondary | ICD-10-CM | POA: Insufficient documentation

## 2021-05-13 DIAGNOSIS — Z13220 Encounter for screening for lipoid disorders: Secondary | ICD-10-CM | POA: Insufficient documentation

## 2021-05-13 DIAGNOSIS — E669 Obesity, unspecified: Secondary | ICD-10-CM | POA: Insufficient documentation

## 2021-05-13 MED ORDER — DESOGESTREL-ETHINYL ESTRADIOL 0.15-0.02/0.01 MG (21/5) PO TABS: 1.0000 | ORAL_TABLET | Freq: Every day | ORAL | 4 refills | Status: DC

## 2021-05-13 NOTE — Assessment & Plan Note (Signed)
Plan for lipid screening blood work today

## 2021-05-13 NOTE — Assessment & Plan Note (Signed)
Plans to discuss with mother regarding menopause Continues to have regular cycle every 35 days, for 7 days Continue OCPs- pt aware of risks, discussed s/s of DVTs

## 2021-05-13 NOTE — Assessment & Plan Note (Signed)
R forearm; stays out of sun, encouraged to wear SPF

## 2021-05-13 NOTE — Assessment & Plan Note (Signed)

## 2021-05-13 NOTE — Assessment & Plan Note (Signed)
Discussed Optivia- slow weight loss, afraid of weight gain on what will happen when she stops restricting calorie intake

## 2021-05-13 NOTE — Progress Notes (Signed)
Complete physical exam   Patient: Deborah Cole   DOB: [birth year]   55 y.o. Female  MRN: [id number] Visit Date: 05/13/2021  Today's healthcare provider: Gwyneth Sprout, FNP   Due for annual physical exam.  Subjective     HPI  Deborah Cole is a 55 y.o. female who presents today for a complete physical exam.  She reports consuming a general diet. The patient has a physically strenuous job, but has no regular exercise apart from work.  She generally feels well. She reports sleeping fairly well. She does not have additional problems to discuss today.   Past Medical History:  Diagnosis Date   Acute stress disorder 01/05/2016   Anxiety    Cancer ([place])    melanoma   Depression 02/18/2015   Polyp of colon    Vitamin D deficiency    Past Surgical History:  Procedure Laterality Date   CESAREAN SECTION     COLONOSCOPY WITH PROPOFOL N/A 03/05/2020   Procedure: COLONOSCOPY WITH PROPOFOL;  Surgeon: Virgel Manifold, MD;  Location: [facility] ENDOSCOPY;  Service: Endoscopy;  Laterality: N/A;   History of Melanoma excision  2005   Malignant   S/P bunionectomy  1984   TUBAL LIGATION     Social History   Socioeconomic History   Marital status: Married    Spouse name: Not on file   Number of children: Not on file   Years of education: Not on file   Highest education level: Not on file  Occupational History   Not on file  Tobacco Use   Smoking status: Never   Smokeless tobacco: Never  Vaping Use   Vaping Use: Never used  Substance and Sexual Activity   Alcohol use: No   Drug use: No   Sexual activity: Yes  Other Topics Concern   Not on file  Social History Narrative   Not on file   Social Determinants of Health   Financial Resource Strain: Not on file  Food Insecurity: Not on file  Transportation Needs: Not on file  Physical Activity: Not on file  Stress: Not on file  Social Connections: Not on file  Intimate Partner Violence: Not on file   Family Status   Relation Name Status   Mother  Alive   Father  Alive   Sister  Alive   Daughter  Alive   Son  Alive   Son  Alive   MGM  (Not Specified)   Neg Hx  (Not Specified)   Family History  Problem Relation Age of Onset   Cancer Maternal Grandmother 22       Lung cancer   Breast cancer Neg Hx    No Known Allergies  Patient Care Team: Gwyneth Sprout, FNP as PCP - General (Family Medicine)   Medications: Outpatient Medications Prior to Visit  Medication Sig   Multiple Vitamin tablet Take by mouth.   [DISCONTINUED] desogestrel-ethinyl estradiol (VIORELE) 0.15-0.02/0.01 MG (21/5) tablet Take 1 tablet by mouth daily.   No facility-administered medications prior to visit.    Review of Systems  All other systems reviewed and are negative.    Objective    BP 116/73   Pulse 66   Temp 98.2 F (36.8 C) (Oral)   Resp 15   Ht 5\' 3"  (1.6 m)   Wt 183 lb 8 oz (83.2 kg)   LMP 05/10/2021 (Exact Date)   SpO2 98%   BMI 32.51 kg/m    Physical Exam Vitals  and nursing note reviewed.  Constitutional:      General: She is awake. She is not in acute distress.    Appearance: Normal appearance. She is well-developed and well-groomed. She is obese. She is not ill-appearing, toxic-appearing or diaphoretic.  HENT:     Head: Normocephalic and atraumatic.     Jaw: There is normal jaw occlusion. No trismus, tenderness, swelling or pain on movement.     Right Ear: Hearing, tympanic membrane, ear canal and external ear normal. There is no impacted cerumen.     Left Ear: Hearing, tympanic membrane, ear canal and external ear normal. There is no impacted cerumen.     Nose: Nose normal. No congestion or rhinorrhea.     Right Turbinates: Not enlarged, swollen or pale.     Left Turbinates: Not enlarged, swollen or pale.     Right Sinus: No maxillary sinus tenderness or frontal sinus tenderness.     Left Sinus: No maxillary sinus tenderness or frontal sinus tenderness.     Mouth/Throat:     Lips: Pink.      Mouth: Mucous membranes are moist. No injury.     Tongue: No lesions.     Pharynx: Oropharynx is clear. Uvula midline. No pharyngeal swelling, oropharyngeal exudate, posterior oropharyngeal erythema or uvula swelling.     Tonsils: No tonsillar exudate or tonsillar abscesses.  Eyes:     General: Lids are normal. Lids are everted, no foreign bodies appreciated. Vision grossly intact. Gaze aligned appropriately. No allergic shiner or visual field deficit.       Right eye: No discharge.        Left eye: No discharge.     Extraocular Movements: Extraocular movements intact.     Conjunctiva/sclera: Conjunctivae normal.     Right eye: Right conjunctiva is not injected. No exudate.    Left eye: Left conjunctiva is not injected. No exudate.    Pupils: Pupils are equal, round, and reactive to light.  Neck:     Thyroid: No thyroid mass, thyromegaly or thyroid tenderness.     Vascular: No carotid bruit.     Trachea: Trachea normal.  Cardiovascular:     Rate and Rhythm: Normal rate and regular rhythm.     Pulses: Normal pulses.          Carotid pulses are 2+ on the right side and 2+ on the left side.      Radial pulses are 2+ on the right side and 2+ on the left side.       Dorsalis pedis pulses are 2+ on the right side and 2+ on the left side.       Posterior tibial pulses are 2+ on the right side and 2+ on the left side.     Heart sounds: Normal heart sounds, S1 normal and S2 normal. No murmur heard.   No friction rub. No gallop.  Pulmonary:     Effort: Pulmonary effort is normal. No respiratory distress.     Breath sounds: Normal breath sounds and air entry. No stridor. No wheezing, rhonchi or rales.  Chest:     Chest wall: No tenderness.  Breasts:    Tanner Score is 5.     Breasts are symmetrical.     Right: Normal.     Left: Normal.     Comments: Normal breast exam; discussed 'know your lemons' campaign and self exam Abdominal:     General: Abdomen is flat. Bowel sounds are normal.  There is no distension.  Palpations: Abdomen is soft. There is no mass.     Tenderness: There is no abdominal tenderness. There is no right CVA tenderness, left CVA tenderness, guarding or rebound.     Hernia: No hernia is present.  Genitourinary:    Comments: Exam deferred; denies complaints Musculoskeletal:        General: No swelling, tenderness, deformity or signs of injury. Normal range of motion.     Cervical back: Full passive range of motion without pain, normal range of motion and neck supple. No edema, rigidity or tenderness. No muscular tenderness.     Right lower leg: No edema.     Left lower leg: No edema.  Lymphadenopathy:     Cervical: No cervical adenopathy.     Right cervical: No superficial, deep or posterior cervical adenopathy.    Left cervical: No superficial, deep or posterior cervical adenopathy.     Upper Body:     Right upper body: No supraclavicular, axillary or pectoral adenopathy.     Left upper body: No supraclavicular, axillary or pectoral adenopathy.  Skin:    General: Skin is warm and dry.     Capillary Refill: Capillary refill takes less than 2 seconds.     Coloration: Skin is not jaundiced or pale.     Findings: No bruising, erythema, lesion or rash.  Neurological:     General: No focal deficit present.     Mental Status: She is alert and oriented to person, place, and time. Mental status is at baseline.     GCS: GCS eye subscore is 4. GCS verbal subscore is 5. GCS motor subscore is 6.     Sensory: Sensation is intact. No sensory deficit.     Motor: Motor function is intact. No weakness.     Coordination: Coordination is intact. Coordination normal.     Gait: Gait is intact. Gait normal.  Psychiatric:        Attention and Perception: Attention and perception normal.        Mood and Affect: Affect normal. Mood is anxious.        Speech: Speech normal.        Behavior: Behavior normal. Behavior is cooperative.        Thought Content: Thought  content normal.        Cognition and Memory: Cognition and memory normal.        Judgment: Judgment normal.     Comments: New principal at work; works as school Electrical engineer- has been coping with nail biting, perhaps, some poor eating behaviors. Does not wish to start meds at this time    Last depression screening scores PHQ 2/9 Scores 05/13/2021 03/10/2020 03/05/2019  PHQ - 2 Score 0 0 1  PHQ- 9 Score 1 - 3   Last fall risk screening Fall Risk  05/13/2021  Falls in the past year? 0  Number falls in past yr: 0  Injury with Fall? 0  Risk for fall due to : -  Follow up -   Last Audit-C alcohol use screening Alcohol Use Disorder Test (AUDIT) 03/10/2020  1. How often do you have a drink containing alcohol? 1  2. How many drinks containing alcohol do you have on a typical day when you are drinking? 0  3. How often do you have six or more drinks on one occasion? 0  AUDIT-C Score 1  Alcohol Brief Interventions/Follow-up AUDIT Score <7 follow-up not indicated   A score of 3 or more in women,  and 4 or more in men indicates increased risk for alcohol abuse, EXCEPT if all of the points are from question 1   No results found for any visits on 05/13/21.  Assessment & Plan    Routine Health Maintenance and Physical Exam  Exercise Activities and Dietary recommendations  Goals   None     Immunization History  Administered Date(s) Administered   Influenza,inj,Quad PF,6+ Mos 09/13/2013   Influenza-Unspecified 05/22/2018   Td 01/13/1999   Tdap 12/11/2008, 10/20/2010   Zoster Recombinat (Shingrix) 12/19/2020    Health Maintenance  Topic Date Due   COVID-19 Vaccine (1) Never done   TETANUS/TDAP  10/19/2020   Zoster Vaccines- Shingrix (2 of 2) 02/13/2021   INFLUENZA VACCINE  03/16/2021   MAMMOGRAM  07/23/2022   COLONOSCOPY (Pts 45-33yrs Insurance coverage will need to be confirmed)  03/05/2025   PAP SMEAR-Modifier  03/10/2025   Hepatitis C Screening  Completed   HIV Screening   Completed   HPV VACCINES  Aged Out    Discussed health benefits of physical activity, and encouraged her to engage in regular exercise appropriate for her age and condition.  Problem List Items Addressed This Visit       Other   Anxiety as acute reaction to gross stress    D/t workplace stress Hold off on medication start       Encounter for repeat prescription of oral contraceptives    Plans to discuss with mother regarding menopause Continues to have regular cycle every 35 days, for 7 days Continue OCPs- pt aware of risks, discussed s/s of DVTs      Relevant Medications   desogestrel-ethinyl estradiol (VIORELE) 0.15-0.02/0.01 MG (21/5) tablet   Encounter for annual physical exam - Primary    Things to do to keep yourself healthy  - Exercise at least 30-45 minutes a day, 3-4 days a week.  - Eat a low-fat diet with lots of fruits and vegetables, up to 7-9 servings per day.  - Seatbelts can save your life. Wear them always.  - Smoke detectors on every level of your home, check batteries every year.  - Eye Doctor - have an eye exam every 1-2 years  - Safe sex - if you may be exposed to STDs, use a condom.  - Alcohol -  If you drink, do it moderately, less than 2 drinks per day.  - [place]. Choose someone to speak for you if you are not able.  - Depression is common in our stressful world.If you're feeling down or losing interest in things you normally enjoy, please come in for a visit.  - Violence - If anyone is threatening or hurting you, please call immediately.        Relevant Orders   CBC with Differential/Platelet   Comprehensive metabolic panel   Hemoglobin A1c   Lipid panel   TSH   Breast cancer screening by mammogram    Due for mammogram screening in 07/2021      Relevant Orders   MM 3D SCREEN BREAST BILATERAL   Obesity (BMI 30.0-34.9)    Discussed Optivia- slow weight loss, afraid of weight gain on what will happen when she stops  restricting calorie intake      Personal history of malignant melanoma    R forearm; stays out of sun, encouraged to wear SPF      Encounter for lipid screening for cardiovascular disease    Plan for lipid screening blood work today  Return in about 1 year (around 05/13/2022) for annual examination.     Vonna Kotyk, FNP, have reviewed all documentation for this visit. The documentation on 05/13/21 for the exam, diagnosis, procedures, and orders are all accurate and complete.    Gwyneth Sprout, [place] [phone] (phone) [phone] (fax)  [place]

## 2021-05-13 NOTE — Assessment & Plan Note (Signed)
D/t workplace stress Hold off on medication start

## 2021-05-13 NOTE — Assessment & Plan Note (Signed)
Due for mammogram screening in 07/2021

## 2021-05-14 LAB — COMPREHENSIVE METABOLIC PANEL
ALT: 23 IU/L (ref 0–32)
AST: 19 IU/L (ref 0–40)
Albumin/Globulin Ratio: 1.9 (ref 1.2–2.2)
Albumin: 4.6 g/dL (ref 3.8–4.9)
Alkaline Phosphatase: 68 IU/L (ref 44–121)
BUN/Creatinine Ratio: 14 (ref 9–23)
BUN: 11 mg/dL (ref 6–24)
Bilirubin Total: 0.6 mg/dL (ref 0.0–1.2)
CO2: 23 mmol/L (ref 20–29)
Calcium: 9.4 mg/dL (ref 8.7–10.2)
Chloride: 102 mmol/L (ref 96–106)
Creatinine, Ser: 0.79 mg/dL (ref 0.57–1.00)
Globulin, Total: 2.4 g/dL (ref 1.5–4.5)
Glucose: 89 mg/dL (ref 70–99)
Potassium: 4.4 mmol/L (ref 3.5–5.2)
Sodium: 139 mmol/L (ref 134–144)
Total Protein: 7 g/dL (ref 6.0–8.5)
eGFR: 90 mL/min/{1.73_m2} (ref >59)

## 2021-05-14 LAB — CBC WITH DIFFERENTIAL/PLATELET
Basophils Absolute: 0 10*3/uL (ref 0.0–0.2)
Basos: 0 %
EOS (ABSOLUTE): 0.1 10*3/uL (ref 0.0–0.4)
Eos: 1 %
Hematocrit: 41.4 % (ref 34.0–46.6)
Hemoglobin: 14 g/dL (ref 11.1–15.9)
Immature Grans (Abs): 0 10*3/uL (ref 0.0–0.1)
Immature Granulocytes: 0 %
Lymphocytes Absolute: 1.7 10*3/uL (ref 0.7–3.1)
Lymphs: 32 %
MCH: 31 pg (ref 26.6–33.0)
MCHC: 33.8 g/dL (ref 31.5–35.7)
MCV: 92 fL (ref 79–97)
Monocytes Absolute: 0.4 10*3/uL (ref 0.1–0.9)
Monocytes: 7 %
Neutrophils Absolute: 3.1 10*3/uL (ref 1.4–7.0)
Neutrophils: 60 %
Platelets: 234 10*3/uL (ref 150–450)
RBC: 4.51 x10E6/uL (ref 3.77–5.28)
RDW: 11.8 % (ref 11.7–15.4)
WBC: 5.3 10*3/uL (ref 3.4–10.8)

## 2021-05-14 LAB — LIPID PANEL
Chol/HDL Ratio: 3.2 ratio (ref 0.0–4.4)
Cholesterol, Total: 203 mg/dL — ABNORMAL HIGH (ref 100–199)
HDL: 64 mg/dL (ref >39)
LDL Chol Calc (NIH): 128 mg/dL — ABNORMAL HIGH (ref 0–99)
Triglycerides: 63 mg/dL (ref 0–149)
VLDL Cholesterol Cal: 11 mg/dL (ref 5–40)

## 2021-05-14 LAB — HEMOGLOBIN A1C
Est. average glucose Bld gHb Est-mCnc: 100 mg/dL
Hgb A1c MFr Bld: 5.1 % (ref 4.8–5.6)

## 2021-05-14 LAB — TSH: TSH: 2.14 u[IU]/mL (ref 0.450–4.500)

## 2021-07-29 ENCOUNTER — Other Ambulatory Visit: Payer: Self-pay

## 2021-07-29 ENCOUNTER — Ambulatory Visit
Admission: RE | Admit: 2021-07-29 | Discharge: 2021-07-29 | Disposition: A | Discharge status: Home / Self Care | Payer: BC Managed Care – PPO | Source: Ambulatory Visit | Attending: Family Medicine | Admitting: Family Medicine

## 2021-07-29 DIAGNOSIS — Z1231 Encounter for screening mammogram for malignant neoplasm of breast: Secondary | ICD-10-CM | POA: Diagnosis present

## 2021-07-30 ENCOUNTER — Other Ambulatory Visit: Payer: Self-pay | Admitting: Family Medicine

## 2021-07-30 DIAGNOSIS — R928 Other abnormal and inconclusive findings on diagnostic imaging of breast: Secondary | ICD-10-CM

## 2021-07-30 DIAGNOSIS — N6489 Other specified disorders of breast: Secondary | ICD-10-CM

## 2021-08-05 ENCOUNTER — Other Ambulatory Visit: Payer: Self-pay

## 2021-08-05 ENCOUNTER — Ambulatory Visit
Admission: RE | Admit: 2021-08-05 | Discharge: 2021-08-05 | Disposition: A | Discharge status: Home / Self Care | Payer: BC Managed Care – PPO | Source: Ambulatory Visit | Attending: Family Medicine | Admitting: Family Medicine

## 2021-08-05 DIAGNOSIS — R928 Other abnormal and inconclusive findings on diagnostic imaging of breast: Secondary | ICD-10-CM

## 2021-08-05 DIAGNOSIS — N6489 Other specified disorders of breast: Secondary | ICD-10-CM | POA: Insufficient documentation

## 2022-01-04 ENCOUNTER — Ambulatory Visit: Payer: BC Managed Care – PPO | Admitting: Family Medicine

## 2022-01-04 ENCOUNTER — Encounter: Payer: Self-pay | Admitting: Family Medicine

## 2022-01-04 VITALS — BP 110/76 | HR 97 | Temp 98.4°F | Resp 16 | Wt 187.9 lb

## 2022-01-04 DIAGNOSIS — R112 Nausea with vomiting, unspecified: Secondary | ICD-10-CM | POA: Diagnosis not present

## 2022-01-04 DIAGNOSIS — M545 Low back pain, unspecified: Secondary | ICD-10-CM

## 2022-01-04 DIAGNOSIS — R6883 Chills (without fever): Secondary | ICD-10-CM

## 2022-01-04 MED ORDER — ONDANSETRON HCL 4 MG PO TABS: 4.0000 mg | ORAL_TABLET | Freq: Three times a day (TID) | ORAL | 0 refills | Status: DC | PRN

## 2022-01-04 MED ORDER — MELOXICAM 15 MG PO TABS: 15.0000 mg | ORAL_TABLET | Freq: Every day | ORAL | 0 refills | Status: DC

## 2022-01-04 NOTE — Progress Notes (Signed)
I,Sulibeya S Dimas,acting as a scribe for Lavon Paganini, MD.,have documented all relevant documentation on the behalf of Lavon Paganini, MD,as directed by  Lavon Paganini, MD while in the presence of Lavon Paganini, MD.   Established patient visit   Patient: Deborah Cole   DOB: [birth year]   56 y.o. Female  MRN: [id number] Visit Date: 01/04/2022  Today's healthcare provider: Lavon Paganini, MD   Chief Complaint  Patient presents with   Back Pain   Subjective    HPI  Patient here today C/O back pain, nausea, weakness x 2 days. Difficulty getting comfortable and getting to sleep.  No improvement with tylenol.  Has headache. Back pain is a little better. Better is moving around.  All across lower back.  Occurred a few weeks ago also but self-resolved.  Urine frequency. No urgency, dysuria, hematuria.  Shivering chills, N/V started yesterday - now better. No fever. +malaise.  Medications: Outpatient Medications Prior to Visit  Medication Sig   desogestrel-ethinyl estradiol (VIORELE) 0.15-0.02/0.01 MG (21/5) tablet Take 1 tablet by mouth daily.   Multiple Vitamin tablet Take by mouth.   No facility-administered medications prior to visit.    Review of Systems  Constitutional:  Positive for chills and fatigue. Negative for appetite change and fever.  Eyes:  Negative for visual disturbance.  Respiratory:  Negative for chest tightness and shortness of breath.   Cardiovascular:  Negative for chest pain.  Gastrointestinal:  Positive for nausea and vomiting. Negative for abdominal pain, constipation and diarrhea.  Musculoskeletal:  Positive for back pain and myalgias.  Neurological:  Positive for tremors, weakness and headaches.      Objective    BP 110/76 (BP Location: Left Arm, Patient Position: Sitting, Cuff Size: Large)   Pulse 97   Temp 98.4 F (36.9 C) (Oral)   Resp 16   Wt 187 lb 14.4 oz (85.2 kg)   SpO2 99%   BMI 33.28 kg/m    Physical  Exam Vitals reviewed.  Constitutional:      General: She is not in acute distress.    Appearance: Normal appearance. She is well-developed. She is not diaphoretic.  HENT:     Head: Normocephalic and atraumatic.     Right Ear: Tympanic membrane, ear canal and external ear normal.     Left Ear: Tympanic membrane, ear canal and external ear normal.     Mouth/Throat:     Mouth: Mucous membranes are moist.     Pharynx: Oropharynx is clear. No oropharyngeal exudate.  Eyes:     General: No scleral icterus.    Extraocular Movements: Extraocular movements intact.     Conjunctiva/sclera: Conjunctivae normal.     Pupils: Pupils are equal, round, and reactive to light.  Neck:     Thyroid: No thyromegaly.  Cardiovascular:     Rate and Rhythm: Normal rate and regular rhythm.     Pulses: Normal pulses.     Heart sounds: Normal heart sounds. No murmur heard. Pulmonary:     Effort: Pulmonary effort is normal. No respiratory distress.     Breath sounds: Normal breath sounds. No wheezing, rhonchi or rales.  Abdominal:     General: There is no distension.     Palpations: Abdomen is soft.     Tenderness: There is no abdominal tenderness.  Musculoskeletal:     Cervical back: Normal range of motion and neck supple. No rigidity.     Right lower leg: No edema.     Left  lower leg: No edema.     Comments: Back: No midline TTP. Slight TTP over b/l SI joints.  No CVA tenderness. Negative SLR b/l.  Lymphadenopathy:     Cervical: No cervical adenopathy.  Skin:    General: Skin is warm and dry.     Findings: No rash.  Neurological:     Mental Status: She is alert and oriented to person, place, and time. Mental status is at baseline.     Sensory: No sensory deficit.     Motor: No weakness.     Gait: Gait normal.  Psychiatric:        Mood and Affect: Mood normal.        Behavior: Behavior normal.     No results found for any visits on 01/04/22.  Assessment & Plan     1. Acute bilateral low back  pain without sciatica 2. Chills 3. Nausea and vomiting, unspecified vomiting type - suspect MSK with possible underlying viral etiology - no s/s of Meningitis, Pyelo on exam today - discussed ruling these out - she does have a lot of exposure to viral illness in her job in [place] - will treat symptomatically with Meloxicam and Zofran - discussed red flags and return precautions (incl s/s of meningitis)   Meds ordered this encounter  Medications   ondansetron (ZOFRAN) 4 MG tablet    Sig: Take 1 tablet (4 mg total) by mouth every 8 (eight) hours as needed for nausea or vomiting.    Dispense:  20 tablet    Refill:  0   meloxicam (MOBIC) 15 MG tablet    Sig: Take 1 tablet (15 mg total) by mouth daily.    Dispense:  30 tablet    Refill:  0     Return if symptoms worsen or fail to improve.      I, Lavon Paganini, MD, have reviewed all documentation for this visit. The documentation on 01/04/22 for the exam, diagnosis, procedures, and orders are all accurate and complete.   Emry Barbato, Dionne Bucy, MD, MPH [facility]

## 2022-01-20 ENCOUNTER — Telehealth: Payer: Self-pay

## 2022-01-20 NOTE — Telephone Encounter (Signed)
Spoke with patient and advised that she have an [facility] per [facility]. Appt was made for 6/15.

## 2022-01-20 NOTE — Telephone Encounter (Signed)
Copied from [place]. Topic: General - Other >> Jan 20, 2022  8:06 AM McGill, Nelva Bush wrote: Reason for CRM: Pt stated she has a health screening form for her new employer.Pt would like to know if PCP can just fill out and fax it without an appointment . Pt is requesting a call back to follow up.  Pt did not have a fax number in hand at this time. She will call back with a fax number or wait to hear back from the office.

## 2022-01-25 NOTE — Progress Notes (Signed)
Complete physical exam   Patient: Deborah Cole   DOB: 1966/07/09   56 y.o. Female  MRN: [id number] Visit Date: 01/28/2022  Today's healthcare provider: Gwyneth Sprout, FNP  Re Introduced to nurse practitioner role and practice setting.  All questions answered.  Discussed provider/patient relationship and expectations.   I,Tiffany J Bragg,acting as a scribe for Gwyneth Sprout, FNP.,have documented all relevant documentation on the behalf of Gwyneth Sprout, FNP,as directed by  Gwyneth Sprout, FNP while in the presence of Gwyneth Sprout, FNP.   Encounter for form complete and check vaccine history/need.  Subjective    Deborah Cole is a 56 y.o. female who presents today for a complete physical exam.  She reports consuming a general diet.  Exercises by walking.  She generally feels well. She reports sleeping fairly well. She does not have additional problems to discuss today.   HPI    Past Medical History:  Diagnosis Date   Acute stress disorder 01/05/2016   Anxiety    Cancer ([place])    melanoma   Depression 02/18/2015   Polyp of colon    Vitamin D deficiency    Past Surgical History:  Procedure Laterality Date   CESAREAN SECTION     COLONOSCOPY WITH PROPOFOL N/A 03/05/2020   Procedure: COLONOSCOPY WITH PROPOFOL;  Surgeon: Virgel Manifold, MD;  Location: [facility] ENDOSCOPY;  Service: Endoscopy;  Laterality: N/A;   History of Melanoma excision  2005   Malignant   S/P bunionectomy  1984   TUBAL LIGATION     Social History   Socioeconomic History   Marital status: Married    Spouse name: Not on file   Number of children: Not on file   Years of education: Not on file   Highest education level: Not on file  Occupational History   Not on file  Tobacco Use   Smoking status: Never   Smokeless tobacco: Never  Vaping Use   Vaping Use: Never used  Substance and Sexual Activity   Alcohol use: No   Drug use: No   Sexual activity: Yes  Other Topics Concern   Not on file   Social History Narrative   Not on file   Social Determinants of Health   Financial Resource Strain: Not on file  Food Insecurity: Not on file  Transportation Needs: Not on file  Physical Activity: Not on file  Stress: Not on file  Social Connections: Not on file  Intimate Partner Violence: Not on file   Family Status  Relation Name Status   Mother  Alive   Father  Alive   Sister  Alive   Daughter  Alive   Son  Alive   Son  Alive   MGM  (Not Specified)   Neg Hx  (Not Specified)   Family History  Problem Relation Age of Onset   Cancer Maternal Grandmother 25       Lung cancer   Breast cancer Neg Hx    No Known Allergies  Patient Care Team: Gwyneth Sprout, FNP as PCP - General (Family Medicine)   Medications: Outpatient Medications Prior to Visit  Medication Sig   desogestrel-ethinyl estradiol (VIORELE) 0.15-0.02/0.01 MG (21/5) tablet Take 1 tablet by mouth daily.   meloxicam (MOBIC) 15 MG tablet Take 1 tablet (15 mg total) by mouth daily.   Multiple Vitamin tablet Take by mouth.   ondansetron (ZOFRAN) 4 MG tablet Take 1 tablet (4 mg total) by mouth  every 8 (eight) hours as needed for nausea or vomiting.   No facility-administered medications prior to visit.    Review of Systems  All other systems reviewed and are negative.   Last CBC Lab Results  Component Value Date   WBC 5.3 05/13/2021   HGB 14.0 05/13/2021   HCT 41.4 05/13/2021   MCV 92 05/13/2021   MCH 31.0 05/13/2021   RDW 11.8 05/13/2021   PLT 234 20/25/4270   Last metabolic panel Lab Results  Component Value Date   GLUCOSE 89 05/13/2021   NA 139 05/13/2021   K 4.4 05/13/2021   CL 102 05/13/2021   CO2 23 05/13/2021   BUN 11 05/13/2021   CREATININE 0.79 05/13/2021   EGFR 90 05/13/2021   CALCIUM 9.4 05/13/2021   PROT 7.0 05/13/2021   ALBUMIN 4.6 05/13/2021   LABGLOB 2.4 05/13/2021   AGRATIO 1.9 05/13/2021   BILITOT 0.6 05/13/2021   ALKPHOS 68 05/13/2021   AST 19 05/13/2021   ALT 23  05/13/2021   Last lipids Lab Results  Component Value Date   CHOL 203 (H) 05/13/2021   HDL 64 05/13/2021   LDLCALC 128 (H) 05/13/2021   TRIG 63 05/13/2021   CHOLHDL 3.2 05/13/2021   Last hemoglobin A1c Lab Results  Component Value Date   HGBA1C 5.1 05/13/2021   Last thyroid functions Lab Results  Component Value Date   TSH 2.140 05/13/2021   Last vitamin D Lab Results  Component Value Date   VD25OH 38.4 02/22/2017    Objective     BP 123/83 (BP Location: Right Arm, Patient Position: Sitting, Cuff Size: Normal)   Pulse 70   Temp 98.8 F (37.1 C) (Oral)   Resp 16   Ht 5' 2" (1.575 m)   Wt 187 lb 9.6 oz (85.1 kg)   SpO2 98%   BMI 34.31 kg/m   BP Readings from Last 3 Encounters:  01/28/22 123/83  01/04/22 110/76  05/13/21 116/73   Wt Readings from Last 3 Encounters:  01/28/22 187 lb 9.6 oz (85.1 kg)  01/04/22 187 lb 14.4 oz (85.2 kg)  05/13/21 183 lb 8 oz (83.2 kg)   SpO2 Readings from Last 3 Encounters:  01/28/22 98%  01/04/22 99%  05/13/21 98%       Physical Exam Vitals and nursing note reviewed.  Constitutional:      General: She is not in acute distress.    Appearance: Normal appearance. She is obese. She is not ill-appearing, toxic-appearing or diaphoretic.  HENT:     Head: Normocephalic and atraumatic.     Right Ear: Hearing normal.     Left Ear: Hearing normal.  Eyes:     Comments: Eye exam completed 07/2021 Wears contacts   Cardiovascular:     Rate and Rhythm: Normal rate and regular rhythm.     Pulses: Normal pulses.     Heart sounds: Normal heart sounds. No murmur heard.    No friction rub. No gallop.  Pulmonary:     Effort: Pulmonary effort is normal. No respiratory distress.     Breath sounds: Normal breath sounds. No stridor. No wheezing, rhonchi or rales.  Chest:     Chest wall: No tenderness.  Musculoskeletal:        General: No swelling, tenderness, deformity or signs of injury. Normal range of motion.     Right lower leg:  No edema.     Left lower leg: No edema.     Comments: Able to lift 50#  No physical limitations   Skin:    General: Skin is warm and dry.     Capillary Refill: Capillary refill takes less than 2 seconds.     Coloration: Skin is not jaundiced or pale.     Findings: No bruising, erythema, lesion or rash.  Neurological:     General: No focal deficit present.     Mental Status: She is alert and oriented to person, place, and time. Mental status is at baseline.     Cranial Nerves: No cranial nerve deficit.     Sensory: No sensory deficit.     Motor: No weakness.     Coordination: Coordination normal.  Psychiatric:        Mood and Affect: Mood normal.        Behavior: Behavior normal.        Thought Content: Thought content normal.        Judgment: Judgment normal.      Last depression screening scores    05/13/2021    8:59 AM 03/10/2020   10:22 AM 03/05/2019    2:53 PM  PHQ 2/9 Scores  PHQ - 2 Score 0 0 1  PHQ- 9 Score 1  3   Last fall risk screening    05/13/2021    8:59 AM  [place] in the past year? 0  Number falls in past yr: 0  Injury with Fall? 0   Last Audit-C alcohol use screening    03/10/2020   10:22 AM  Alcohol Use Disorder Test (AUDIT)  1. How often do you have a drink containing alcohol? 1  2. How many drinks containing alcohol do you have on a typical day when you are drinking? 0  3. How often do you have six or more drinks on one occasion? 0  AUDIT-C Score 1  Alcohol Brief Interventions/Follow-up AUDIT Score <7 follow-up not indicated   A score of 3 or more in women, and 4 or more in men indicates increased risk for alcohol abuse, EXCEPT if all of the points are from question 1   No results found for any visits on 01/28/22.  Assessment & Plan    Routine Health Maintenance and Physical Exam  Exercise Activities and Dietary recommendations  Goals   None     Immunization History  Administered Date(s) Administered   Influenza,inj,Quad  PF,6+ Mos 09/13/2013   Influenza-Unspecified 05/22/2018   Td 01/13/1999   Tdap 10/20/2010, 01/28/2022   Zoster Recombinat (Shingrix) 12/19/2020    Health Maintenance  Topic Date Due   COVID-19 Vaccine (1) Never done   Zoster Vaccines- Shingrix (2 of 2) 02/13/2021   INFLUENZA VACCINE  03/16/2022   MAMMOGRAM  07/30/2023   COLONOSCOPY (Pts 45-38yr Insurance coverage will need to be confirmed)  03/05/2025   PAP SMEAR-Modifier  03/10/2025   TETANUS/TDAP  01/29/2032   Hepatitis C Screening  Completed   HIV Screening  Completed   HPV VACCINES  Aged Out    Discussed health benefits of physical activity, and encouraged her to engage in regular exercise appropriate for her age and condition.  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  TDAP was needed; completed today.  Negative risk factors for TB. Is vaccinated for Hep B; will not be working with blood borne pathogens, not necessary at this time for Hep B titer.  Patient is in agreement.      Need for Tdap vaccination    Consent received; no immediate side effects from vaccination. Repeat in 10 years or as needed.  VIS attached to AVS.      Relevant Orders   Tdap vaccine greater than or equal to 7yo IM (Completed)     Return in about 3 months (around 04/30/2022) for annual examination.     Vonna Kotyk, FNP, have reviewed all documentation for this visit. The documentation on 01/28/22 for the exam, diagnosis, procedures, and orders are all accurate and complete.    Gwyneth Sprout, [place] [phone] (phone) [phone] (fax)  [place]

## 2022-01-28 ENCOUNTER — Ambulatory Visit: Payer: BC Managed Care – PPO | Admitting: Family Medicine

## 2022-01-28 ENCOUNTER — Encounter: Payer: Self-pay | Admitting: Family Medicine

## 2022-01-28 VITALS — BP 123/83 | HR 70 | Temp 98.8°F | Resp 16 | Ht 62.0 in | Wt 187.6 lb

## 2022-01-28 DIAGNOSIS — Z0289 Encounter for other administrative examinations: Secondary | ICD-10-CM | POA: Insufficient documentation

## 2022-01-28 DIAGNOSIS — Z23 Encounter for immunization: Secondary | ICD-10-CM | POA: Insufficient documentation

## 2022-01-28 NOTE — Assessment & Plan Note (Signed)
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 She is here today with health form, verifying her immunization history as well as her heart/lung health, physical ability, and hearing/vision.  TDAP was needed; completed today.  Negative risk factors for TB. Is vaccinated for Hep B; will not be working with blood borne pathogens, not necessary at this time for Hep B titer.  Patient is in agreement.

## 2022-01-28 NOTE — Assessment & Plan Note (Signed)
Consent received; no immediate side effects from vaccination. Repeat in 10 years or as needed.  VIS attached to AVS.

## 2022-01-29 ENCOUNTER — Other Ambulatory Visit: Payer: Self-pay | Admitting: Family Medicine

## 2022-01-29 NOTE — Telephone Encounter (Signed)
Requested Prescriptions  Pending Prescriptions Disp Refills  . meloxicam (MOBIC) 15 MG tablet [Pharmacy Med Name: MELOXICAM 15MG TABLETS] 30 tablet 0    Sig: TAKE 1 TABLET(15 MG) BY MOUTH DAILY     Analgesics:  COX2 Inhibitors Failed - 01/29/2022 10:20 AM      Failed - Manual Review: Labs are only required if the patient has taken medication for more than 8 weeks.      Passed - HGB in normal range and within 360 days    Hemoglobin  Date Value Ref Range Status  05/13/2021 14.0 11.1 - 15.9 g/dL Final         Passed - Cr in normal range and within 360 days    Creatinine, Ser  Date Value Ref Range Status  05/13/2021 0.79 0.57 - 1.00 mg/dL Final         Passed - HCT in normal range and within 360 days    Hematocrit  Date Value Ref Range Status  05/13/2021 41.4 34.0 - 46.6 % Final         Passed - AST in normal range and within 360 days    AST  Date Value Ref Range Status  05/13/2021 19 0 - 40 IU/L Final         Passed - ALT in normal range and within 360 days    ALT  Date Value Ref Range Status  05/13/2021 23 0 - 32 IU/L Final         Passed - eGFR is 30 or above and within 360 days    GFR calc Af Amer  Date Value Ref Range Status  03/10/2020 96 >59 mL/min/1.73 Final    Comment:    **[facility] currently reports eGFR in compliance with the current**   recommendations of the [employer]. [facility] will   update reporting as new guidelines are published from the NKF-ASN   Task force.    GFR calc non Af Amer  Date Value Ref Range Status  03/10/2020 83 >59 mL/min/1.73 Final   eGFR  Date Value Ref Range Status  05/13/2021 90 >59 mL/min/1.73 Final         Passed - Patient is not pregnant      Passed - Valid encounter within last 12 months    Recent Outpatient Visits          Yesterday Encounter for review of form with patient   [facility] Tally Joe T, FNP   3 weeks ago Acute bilateral low back pain without sciatica   [facility], Dionne Bucy, MD   8 months ago Encounter for annual physical exam   [facility] Gwyneth Sprout, FNP   1 year ago Annual physical exam   [facility], Clearnce Sorrel, [place]   2 years ago Annual physical exam   [facility] Mar Daring, [place]

## 2022-09-13 ENCOUNTER — Encounter: Payer: Self-pay | Admitting: Family Medicine

## 2022-09-13 ENCOUNTER — Ambulatory Visit (INDEPENDENT_AMBULATORY_CARE_PROVIDER_SITE_OTHER): Payer: BC Managed Care – PPO | Admitting: Family Medicine

## 2022-09-13 VITALS — BP 121/87 | HR 66 | Temp 98.0°F | Ht 62.0 in | Wt 189.7 lb

## 2022-09-13 DIAGNOSIS — Z0000 Encounter for general adult medical examination without abnormal findings: Secondary | ICD-10-CM

## 2022-09-13 DIAGNOSIS — E669 Obesity, unspecified: Secondary | ICD-10-CM | POA: Diagnosis not present

## 2022-09-13 DIAGNOSIS — Z1231 Encounter for screening mammogram for malignant neoplasm of breast: Secondary | ICD-10-CM | POA: Diagnosis not present

## 2022-09-13 DIAGNOSIS — Z85820 Personal history of malignant melanoma of skin: Secondary | ICD-10-CM | POA: Diagnosis not present

## 2022-09-13 DIAGNOSIS — E7800 Pure hypercholesterolemia, unspecified: Secondary | ICD-10-CM

## 2022-09-13 NOTE — Assessment & Plan Note (Signed)
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 Using APAP and NSAID, PRN to assist Things to do to keep yourself healthy  - Exercise at least 30-45 minutes a day, 3-4 days a week.  - Eat a low-fat diet with lots of fruits and vegetables, up to 7-9 servings per day.  - Seatbelts can save your life. Wear them always.  - Smoke detectors on every level of your home, check batteries every year.  - Eye Doctor - have an eye exam every 1-2 years  - Safe sex - if you may be exposed to STDs, use a condom.  - Alcohol -  If you drink, do it moderately, less than 2 drinks per day.  - [place]. Choose someone to speak for you if you are not able.  - Depression is common in our stressful world.If you're feeling down or losing interest in things you normally enjoy, please come in for a visit.  - Violence - If anyone is threatening or hurting you, please call immediately.

## 2022-09-13 NOTE — Assessment & Plan Note (Signed)
No current skin concerns; continues to follow with Derm

## 2022-09-13 NOTE — Assessment & Plan Note (Signed)
Chronic, stable Body mass index is 34.7 kg/m. Continue to recommend balanced, lower carb meals. Smaller meal size, adding snacks. Choosing water as drink of choice and increasing purposeful exercise.

## 2022-09-13 NOTE — Assessment & Plan Note (Signed)
Chronic, previously elevated Repeat LP with CPE The 10-year ASCVD risk score (Arnett DK, et al., 2019) is: 1.4%   Values used to calculate the score:     Age: 57 years     Sex: Female     Is Non-Hispanic African American: No     Diabetic: No     Tobacco smoker: No     Systolic Blood Pressure: 149 mmHg     Is BP treated: No     HDL Cholesterol: 64 mg/dL     Total Cholesterol: 203 mg/dL

## 2022-09-13 NOTE — Assessment & Plan Note (Signed)
Due for screening for mammogram, denies breast concerns, provided with phone number to call and schedule appointment for mammogram. Encouraged to repeat breast cancer screening every 1-2 years.  

## 2022-09-13 NOTE — Patient Instructions (Signed)
Please call and schedule your mammogram:  [facility]  [address], [address] [facility] [place],  [place]  [address] Get Driving Directions Main: [phone]  Sunday:Closed Monday:7:20 AM - 5:00 PM Tuesday:7:20 AM - 5:00 PM Wednesday:7:20 AM - 5:00 PM Thursday:7:20 AM - 5:00 PM Friday:7:20 AM - 4:30 PM Saturday:Closed  

## 2022-09-13 NOTE — Progress Notes (Signed)
I,Connie R Striblin,acting as a Education administrator for Gwyneth Sprout, FNP.,have documented all relevant documentation on the behalf of Gwyneth Sprout, FNP,as directed by  Gwyneth Sprout, FNP while in the presence of Gwyneth Sprout, FNP.   Complete physical exam   Patient: Deborah Cole   DOB: [birth year]   57 y.o. Female  MRN: [id number] Visit Date: 09/13/2022  Today's healthcare provider: Gwyneth Sprout, FNP  Re Introduced to nurse practitioner role and practice setting.  All questions answered.  Discussed provider/patient relationship and expectations.   Chief Complaint  Patient presents with   Annual Exam   Subjective    Deborah Cole is a 57 y.o. female who presents today for a complete physical exam.  She reports consuming a general diet. The patient has a physically strenuous job, but has no regular exercise apart from work.  She generally feels well. She reports sleeping fairly well. She does  have additional problems to discuss today.  HPI  Pt would like to discuss joint pain.   Past Medical History:  Diagnosis Date   Acute stress disorder 01/05/2016   Anxiety    Cancer ([place])    melanoma   Depression 02/18/2015   Polyp of colon    Vitamin D deficiency    Past Surgical History:  Procedure Laterality Date   CESAREAN SECTION     COLONOSCOPY WITH PROPOFOL N/A 03/05/2020   Procedure: COLONOSCOPY WITH PROPOFOL;  Surgeon: Virgel Manifold, MD;  Location: [facility] ENDOSCOPY;  Service: Endoscopy;  Laterality: N/A;   History of Melanoma excision  2005   Malignant   S/P bunionectomy  1984   TUBAL LIGATION     Social History   Socioeconomic History   Marital status: Married    Spouse name: Not on file   Number of children: Not on file   Years of education: Not on file   Highest education level: Not on file  Occupational History   Not on file  Tobacco Use   Smoking status: Never   Smokeless tobacco: Never  Vaping Use   Vaping Use: Never used  Substance and Sexual Activity    Alcohol use: No   Drug use: No   Sexual activity: Yes  Other Topics Concern   Not on file  Social History Narrative   Not on file   Social Determinants of Health   Financial Resource Strain: Not on file  Food Insecurity: Not on file  Transportation Needs: Not on file  Physical Activity: Not on file  Stress: Not on file  Social Connections: Not on file  Intimate Partner Violence: Not on file   Family Status  Relation Name Status   Mother  Alive   Father  Alive   Sister  Alive   Daughter  Alive   Son  Alive   Son  Alive   MGM  (Not Specified)   Neg Hx  (Not Specified)   Family History  Problem Relation Age of Onset   Cancer Maternal Grandmother 74       Lung cancer   Breast cancer Neg Hx    No Known Allergies  Patient Care Team: Gwyneth Sprout, FNP as PCP - General (Family Medicine)   Medications: Outpatient Medications Prior to Visit  Medication Sig   Multiple Vitamin tablet Take by mouth.   [DISCONTINUED] desogestrel-ethinyl estradiol (VIORELE) 0.15-0.02/0.01 MG (21/5) tablet Take 1 tablet by mouth daily. (Patient not taking: Reported on 09/13/2022)   [DISCONTINUED] meloxicam (MOBIC)  15 MG tablet TAKE 1 TABLET(15 MG) BY MOUTH DAILY (Patient not taking: Reported on 09/13/2022)   [DISCONTINUED] ondansetron (ZOFRAN) 4 MG tablet Take 1 tablet (4 mg total) by mouth every 8 (eight) hours as needed for nausea or vomiting. (Patient not taking: Reported on 09/13/2022)   No facility-administered medications prior to visit.    Review of Systems  All other systems reviewed and are negative.   Last CBC Lab Results  Component Value Date   WBC 5.3 05/13/2021   HGB 14.0 05/13/2021   HCT 41.4 05/13/2021   MCV 92 05/13/2021   MCH 31.0 05/13/2021   RDW 11.8 05/13/2021   PLT 234 16/05/9603   Last metabolic panel Lab Results  Component Value Date   GLUCOSE 89 05/13/2021   NA 139 05/13/2021   K 4.4 05/13/2021   CL 102 05/13/2021   CO2 23 05/13/2021   BUN 11 05/13/2021    CREATININE 0.79 05/13/2021   EGFR 90 05/13/2021   CALCIUM 9.4 05/13/2021   PROT 7.0 05/13/2021   ALBUMIN 4.6 05/13/2021   LABGLOB 2.4 05/13/2021   AGRATIO 1.9 05/13/2021   BILITOT 0.6 05/13/2021   ALKPHOS 68 05/13/2021   AST 19 05/13/2021   ALT 23 05/13/2021   Last lipids Lab Results  Component Value Date   CHOL 203 (H) 05/13/2021   HDL 64 05/13/2021   LDLCALC 128 (H) 05/13/2021   TRIG 63 05/13/2021   CHOLHDL 3.2 05/13/2021   Last hemoglobin A1c Lab Results  Component Value Date   HGBA1C 5.1 05/13/2021   Last thyroid functions Lab Results  Component Value Date   TSH 2.140 05/13/2021      Objective    BP 121/87 (BP Location: Left Arm, Patient Position: Sitting, Cuff Size: Normal)   Pulse 66   Temp 98 F (36.7 C) (Oral)   Ht '5\' 2"'$  (1.575 m)   Wt 189 lb 11.2 oz (86 kg)   SpO2 100%   BMI 34.70 kg/m  BP Readings from Last 3 Encounters:  09/13/22 121/87  01/28/22 123/83  01/04/22 110/76   Wt Readings from Last 3 Encounters:  09/13/22 189 lb 11.2 oz (86 kg)  01/28/22 187 lb 9.6 oz (85.1 kg)  01/04/22 187 lb 14.4 oz (85.2 kg)   SpO2 Readings from Last 3 Encounters:  09/13/22 100%  01/28/22 98%  01/04/22 99%       Physical Exam Vitals and nursing note reviewed.  Constitutional:      General: She is awake. She is not in acute distress.    Appearance: Normal appearance. She is well-developed and well-groomed. She is obese. She is not ill-appearing, toxic-appearing or diaphoretic.  HENT:     Head: Normocephalic and atraumatic.     Jaw: There is normal jaw occlusion. No trismus, tenderness, swelling or pain on movement.     Right Ear: Hearing, tympanic membrane, ear canal and external ear normal. There is no impacted cerumen.     Left Ear: Hearing, tympanic membrane, ear canal and external ear normal. There is no impacted cerumen.     Nose: Nose normal. No congestion or rhinorrhea.     Right Turbinates: Not enlarged, swollen or pale.     Left Turbinates:  Not enlarged, swollen or pale.     Right Sinus: No maxillary sinus tenderness or frontal sinus tenderness.     Left Sinus: No maxillary sinus tenderness or frontal sinus tenderness.     Mouth/Throat:     Lips: Pink.  Mouth: Mucous membranes are moist. No injury.     Tongue: No lesions.     Pharynx: Oropharynx is clear. Uvula midline. No pharyngeal swelling, oropharyngeal exudate, posterior oropharyngeal erythema or uvula swelling.     Tonsils: No tonsillar exudate or tonsillar abscesses.  Eyes:     General: Lids are normal. Lids are everted, no foreign bodies appreciated. Vision grossly intact. Gaze aligned appropriately. No allergic shiner or visual field deficit.       Right eye: No discharge.        Left eye: No discharge.     Extraocular Movements: Extraocular movements intact.     Conjunctiva/sclera: Conjunctivae normal.     Right eye: Right conjunctiva is not injected. No exudate.    Left eye: Left conjunctiva is not injected. No exudate.    Pupils: Pupils are equal, round, and reactive to light.  Neck:     Thyroid: No thyroid mass, thyromegaly or thyroid tenderness.     Vascular: No carotid bruit.     Trachea: Trachea normal.  Cardiovascular:     Rate and Rhythm: Normal rate and regular rhythm.     Pulses: Normal pulses.          Carotid pulses are 2+ on the right side and 2+ on the left side.      Radial pulses are 2+ on the right side and 2+ on the left side.       Dorsalis pedis pulses are 2+ on the right side and 2+ on the left side.       Posterior tibial pulses are 2+ on the right side and 2+ on the left side.     Heart sounds: Normal heart sounds, S1 normal and S2 normal. No murmur heard.    No friction rub. No gallop.  Pulmonary:     Effort: Pulmonary effort is normal. No respiratory distress.     Breath sounds: Normal breath sounds and air entry. No stridor. No wheezing, rhonchi or rales.  Chest:     Chest wall: No tenderness.  Abdominal:     General: Abdomen  is flat. Bowel sounds are normal. There is no distension.     Palpations: Abdomen is soft. There is no mass.     Tenderness: There is no abdominal tenderness. There is no right CVA tenderness, left CVA tenderness, guarding or rebound.     Hernia: No hernia is present.  Genitourinary:    Comments: Exam deferred; denies complaints Musculoskeletal:        General: No swelling, tenderness, deformity or signs of injury. Normal range of motion.     Cervical back: Full passive range of motion without pain, normal range of motion and neck supple. No edema, rigidity or tenderness. No muscular tenderness.     Right lower leg: No edema.     Left lower leg: No edema.  Lymphadenopathy:     Cervical: No cervical adenopathy.     Right cervical: No superficial, deep or posterior cervical adenopathy.    Left cervical: No superficial, deep or posterior cervical adenopathy.  Skin:    General: Skin is warm and dry.     Capillary Refill: Capillary refill takes less than 2 seconds.     Coloration: Skin is not jaundiced or pale.     Findings: No bruising, erythema, lesion or rash.  Neurological:     General: No focal deficit present.     Mental Status: She is alert and oriented to person, place, and time. Mental  status is at baseline.     GCS: GCS eye subscore is 4. GCS verbal subscore is 5. GCS motor subscore is 6.     Sensory: Sensation is intact. No sensory deficit.     Motor: Motor function is intact. No weakness.     Coordination: Coordination is intact. Coordination normal.     Gait: Gait is intact. Gait normal.  Psychiatric:        Attention and Perception: Attention and perception normal.        Mood and Affect: Mood and affect normal.        Speech: Speech normal.        Behavior: Behavior normal. Behavior is cooperative.        Thought Content: Thought content normal.        Cognition and Memory: Cognition and memory normal.        Judgment: Judgment normal.       Last depression screening  scores    09/13/2022    9:49 AM 05/13/2021    8:59 AM 03/10/2020   10:22 AM  PHQ 2/9 Scores  PHQ - 2 Score 1 0 0  PHQ- 9 Score 1 1    Last fall risk screening    09/13/2022    9:49 AM  [place] in the past year? 0  Number falls in past yr: 0  Injury with Fall? 0   Last Audit-C alcohol use screening    09/13/2022    9:50 AM  Alcohol Use Disorder Test (AUDIT)  1. How often do you have a drink containing alcohol? 2  2. How many drinks containing alcohol do you have on a typical day when you are drinking? 1  3. How often do you have six or more drinks on one occasion? 0  AUDIT-C Score 3   A score of 3 or more in women, and 4 or more in men indicates increased risk for alcohol abuse, EXCEPT if all of the points are from question 1   No results found for any visits on 09/13/22.  Assessment & Plan    Routine Health Maintenance and Physical Exam  Exercise Activities and Dietary recommendations  Goals   None     Immunization History  Administered Date(s) Administered   COVID-19, mRNA, vaccine(Comirnaty)12 years and older 07/01/2022   Influenza,inj,Quad PF,6+ Mos 09/13/2013   Influenza-Unspecified 05/22/2018, 07/01/2022   PFIZER(Purple Top)SARS-COV-2 Vaccination 10/14/2019, 11/11/2019, 07/23/2020, 04/29/2021   Td 01/13/1999   Tdap 10/20/2010, 01/28/2022   Zoster Recombinat (Shingrix) 09/03/2020, 12/19/2020    Health Maintenance  Topic Date Due   MAMMOGRAM  07/30/2023   COLONOSCOPY (Pts 45-52yr Insurance coverage will need to be confirmed)  03/05/2025   PAP SMEAR-Modifier  03/10/2025   DTaP/Tdap/Td (4 - Td or Tdap) 01/29/2032   INFLUENZA VACCINE  Completed   COVID-19 Vaccine  Completed   Hepatitis C Screening  Completed   HIV Screening  Completed   Zoster Vaccines- Shingrix  Completed   HPV VACCINES  Aged Out    Discussed health benefits of physical activity, and encouraged her to engage in regular exercise appropriate for her age and condition.  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 Using APAP and NSAID, PRN to assist  Things to do to keep yourself healthy  - Exercise at least 30-45 minutes a day, 3-4 days a week.  - Eat a low-fat diet with lots of fruits and vegetables, up to 7-9 servings per day.  - Seatbelts can save your life. Wear them always.  - Smoke detectors on every level of your home, check batteries every year.  - Eye Doctor - have an eye exam every 1-2 years  - Safe sex - if you may be exposed to STDs, use a condom.  - Alcohol -  If you drink, do it moderately, less than 2 drinks per day.  - [place]. Choose someone to speak for you if you are not able.  - Depression is common in our stressful world.If you're feeling down or losing interest in things you normally enjoy, please come in for a visit.  - Violence - If anyone is threatening or hurting you, please call immediately.       Relevant Orders   CBC with Differential/Platelet   Comprehensive Metabolic Panel (CMET)   Lipid panel   TSH   Breast cancer screening by mammogram    Due for screening for mammogram, denies breast concerns, provided with phone number to call and schedule appointment for mammogram. Encouraged to repeat breast cancer screening every 1-2 years.       Relevant Orders   MM 3D SCREEN BREAST BILATERAL   Elevated LDL cholesterol level    Chronic, previously elevated Repeat LP with CPE The 10-year ASCVD risk score (Arnett DK, et al., 2019) is: 1.4%   Values used to calculate the score:     Age: 40 years     Sex: Female     Is Non-Hispanic African American: No     Diabetic: No     Tobacco smoker: No     Systolic Blood Pressure: 073 mmHg     Is BP treated: No     HDL Cholesterol: 64 mg/dL     Total  Cholesterol: 203 mg/dL       Relevant Orders   Lipid panel   History of melanoma    No current skin concerns; continues to follow with Derm      Obesity (BMI 30.0-34.9)    Chronic, stable Body mass index is 34.7 kg/m. Continue to recommend balanced, lower carb meals. Smaller meal size, adding snacks. Choosing water as drink of choice and increasing purposeful exercise.       Relevant Orders   CBC with Differential/Platelet   Comprehensive Metabolic Panel (CMET)   Lipid panel   TSH     Return in about 1 year (around 09/14/2023) for annual examination.     Vonna Kotyk, FNP, have reviewed all documentation for this visit. The documentation on 09/13/22 for the exam, diagnosis, procedures, and orders are all accurate and complete.    Gwyneth Sprout, [place] [phone] (phone) [phone] (fax)  Deborah Cole

## 2022-09-14 ENCOUNTER — Ambulatory Visit: Payer: Self-pay | Admitting: *Deleted

## 2022-09-14 LAB — CBC WITH DIFFERENTIAL/PLATELET
Basophils Absolute: 0 10*3/uL (ref 0.0–0.2)
Basos: 0 %
EOS (ABSOLUTE): 0.1 10*3/uL (ref 0.0–0.4)
Eos: 2 %
Hematocrit: 39.9 % (ref 34.0–46.6)
Hemoglobin: 13.4 g/dL (ref 11.1–15.9)
Immature Grans (Abs): 0 10*3/uL (ref 0.0–0.1)
Immature Granulocytes: 0 %
Lymphocytes Absolute: 2.1 10*3/uL (ref 0.7–3.1)
Lymphs: 39 %
MCH: 29.6 pg (ref 26.6–33.0)
MCHC: 33.6 g/dL (ref 31.5–35.7)
MCV: 88 fL (ref 79–97)
Monocytes Absolute: 0.4 10*3/uL (ref 0.1–0.9)
Monocytes: 7 %
Neutrophils Absolute: 2.9 10*3/uL (ref 1.4–7.0)
Neutrophils: 52 %
Platelets: 242 10*3/uL (ref 150–450)
RBC: 4.52 x10E6/uL (ref 3.77–5.28)
RDW: 11.9 % (ref 11.7–15.4)
WBC: 5.5 10*3/uL (ref 3.4–10.8)

## 2022-09-14 LAB — COMPREHENSIVE METABOLIC PANEL
ALT: 21 IU/L (ref 0–32)
AST: 17 IU/L (ref 0–40)
Albumin/Globulin Ratio: 2.1 (ref 1.2–2.2)
Albumin: 4.8 g/dL (ref 3.8–4.9)
Alkaline Phosphatase: 97 IU/L (ref 44–121)
BUN/Creatinine Ratio: 17 (ref 9–23)
BUN: 14 mg/dL (ref 6–24)
Bilirubin Total: 0.5 mg/dL (ref 0.0–1.2)
CO2: 22 mmol/L (ref 20–29)
Calcium: 9.8 mg/dL (ref 8.7–10.2)
Chloride: 106 mmol/L (ref 96–106)
Creatinine, Ser: 0.82 mg/dL (ref 0.57–1.00)
Globulin, Total: 2.3 g/dL (ref 1.5–4.5)
Glucose: 87 mg/dL (ref 70–99)
Potassium: 4.6 mmol/L (ref 3.5–5.2)
Sodium: 144 mmol/L (ref 134–144)
Total Protein: 7.1 g/dL (ref 6.0–8.5)
eGFR: 85 mL/min/{1.73_m2} (ref >59)

## 2022-09-14 LAB — LIPID PANEL
Chol/HDL Ratio: 3.3 ratio (ref 0.0–4.4)
Cholesterol, Total: 193 mg/dL (ref 100–199)
HDL: 59 mg/dL (ref >39)
LDL Chol Calc (NIH): 121 mg/dL — ABNORMAL HIGH (ref 0–99)
Triglycerides: 72 mg/dL (ref 0–149)
VLDL Cholesterol Cal: 13 mg/dL (ref 5–40)

## 2022-09-14 LAB — TSH: TSH: 2.93 u[IU]/mL (ref 0.450–4.500)

## 2022-09-14 NOTE — Telephone Encounter (Signed)
Reason for Disposition  [1] Follow-up call to recent contact AND [2] information only call, no triage required  Answer Assessment - Initial Assessment Questions 1. REASON FOR CALL or QUESTION: "What is your reason for calling today?" or "How can I best help you?" or "What question do you have that I can help answer?"     Pt called in and was given the lab result message from Tally Joe, [place] dated 09/14/2022 at 6:14 AM.  No questions from pt.  Protocols used: Information Only Call - No Triage-A-AH

## 2022-09-14 NOTE — Progress Notes (Signed)
Total cholesterol is improved; however, LDL/Bad cholesterol remains elevated. I continue to recommend diet low in saturated fat and regular exercise - 30 min at least 5 times per week  All other labs normal and stable.  Please call and schedule your mammogram.  Take care, Deborah Cole, [name] [place], [place] [address] [phone] (phone) [phone] (fax) [place]

## 2022-11-09 ENCOUNTER — Ambulatory Visit: Payer: BC Managed Care – PPO | Admitting: Family Medicine

## 2022-11-09 ENCOUNTER — Encounter: Payer: Self-pay | Admitting: Family Medicine

## 2022-11-09 VITALS — BP 121/89 | HR 89 | Temp 98.1°F | Resp 11 | Ht 61.0 in | Wt 194.2 lb

## 2022-11-09 DIAGNOSIS — J301 Allergic rhinitis due to pollen: Secondary | ICD-10-CM | POA: Diagnosis not present

## 2022-11-09 DIAGNOSIS — J0140 Acute pansinusitis, unspecified: Secondary | ICD-10-CM | POA: Diagnosis not present

## 2022-11-09 DIAGNOSIS — H6993 Unspecified Eustachian tube disorder, bilateral: Secondary | ICD-10-CM

## 2022-11-09 MED ORDER — CETIRIZINE HCL 10 MG PO TABS: 10.0000 mg | ORAL_TABLET | Freq: Every day | ORAL | 11 refills | Status: AC

## 2022-11-09 MED ORDER — FLUTICASONE PROPIONATE 50 MCG/ACT NA SUSP: 2.0000 | Freq: Every day | NASAL | 6 refills | Status: AC

## 2022-11-09 MED ORDER — AMOXICILLIN-POT CLAVULANATE 875-125 MG PO TABS: 1.0000 | ORAL_TABLET | Freq: Two times a day (BID) | ORAL | 0 refills | Status: DC

## 2022-11-09 NOTE — Progress Notes (Signed)
I,J'ya E Hunter,acting as a scribe for Gwyneth Sprout, FNP.,have documented all relevant documentation on the behalf of Gwyneth Sprout, FNP,as directed by  Gwyneth Sprout, FNP while in the presence of Gwyneth Sprout, FNP.  Established patient visit  Patient: Deborah Cole   DOB: 1965/10/24   57 y.o. Female  MRN: [id number] Visit Date: 11/09/2022  Today's healthcare provider: Gwyneth Sprout, FNP  Re Introduced to nurse practitioner role and practice setting.  All questions answered.  Discussed provider/patient relationship and expectations.  Chief Complaint  Patient presents with   Sinus Problem   Subjective    Sinus Problem The current episode started in the past 7 days. The problem has been gradually worsening since onset. There has been no fever. Associated symptoms include congestion, coughing (after laying down, productive), ear pain ("feels like fluid moving", pt reports having to pop ears), headaches (off and on) and sinus pressure. Pertinent negatives include no sore throat. Past treatments include oral decongestants, nasal decongestants and saline nose sprays. The treatment provided mild relief.  Patient reports that similar situation occurred last year. States that congestion worsened after being outside all day Sunday.     Medications: Outpatient Medications Prior to Visit  Medication Sig   Multiple Vitamin tablet Take by mouth.   No facility-administered medications prior to visit.    Review of Systems  HENT:  Positive for congestion, ear pain ("feels like fluid moving", pt reports having to pop ears) and sinus pressure. Negative for sore throat.   Respiratory:  Positive for cough (after laying down, productive).   Neurological:  Positive for headaches (off and on).     Objective    BP 121/89 (BP Location: Right Arm, Patient Position: Sitting, Cuff Size: Large)   Pulse 89   Temp 98.1 F (36.7 C) (Oral)   Resp 11   Ht 5\' 1"  (1.549 m)   Wt 194 lb 3.2 oz (88.1 kg)    SpO2 100%   BMI 36.69 kg/m   Physical Exam Vitals and nursing note reviewed.  Constitutional:      General: She is not in acute distress.    Appearance: Normal appearance. She is obese. She is ill-appearing. She is not toxic-appearing or diaphoretic.  HENT:     Head: Normocephalic and atraumatic.     Right Ear: External ear normal.     Left Ear: External ear normal.     Nose: Congestion present.     Mouth/Throat:     Pharynx: Oropharynx is clear. Posterior oropharyngeal erythema present.  Cardiovascular:     Rate and Rhythm: Normal rate and regular rhythm.     Pulses: Normal pulses.     Heart sounds: Normal heart sounds. No murmur heard.    No friction rub. No gallop.  Pulmonary:     Effort: Pulmonary effort is normal. No respiratory distress.     Breath sounds: Normal breath sounds. No stridor. No wheezing, rhonchi or rales.  Chest:     Chest wall: No tenderness.  Musculoskeletal:        General: No swelling, deformity or signs of injury. Normal range of motion.     Cervical back: Tenderness present.     Right lower leg: No edema.     Left lower leg: No edema.  Skin:    General: Skin is warm and dry.     Capillary Refill: Capillary refill takes less than 2 seconds.     Coloration: Skin is not jaundiced  or pale.     Findings: No bruising, erythema, lesion or rash.  Neurological:     General: No focal deficit present.     Mental Status: She is alert and oriented to person, place, and time. Mental status is at baseline.     Cranial Nerves: No cranial nerve deficit.     Sensory: No sensory deficit.     Motor: No weakness.     Coordination: Coordination normal.  Psychiatric:        Mood and Affect: Mood normal.        Behavior: Behavior normal.        Thought Content: Thought content normal.        Judgment: Judgment normal.     No results found for any visits on 11/09/22.  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       Relevant Medications   amoxicillin-clavulanate (AUGMENTIN) 875-125 MG tablet   fluticasone (FLONASE) 50 MCG/ACT nasal spray   cetirizine (ZYRTEC) 10 MG tablet   Seasonal allergic rhinitis due to pollen    Recommend daily antihistamine and intranasal steroid to assist       Relevant Medications   fluticasone (FLONASE) 50 MCG/ACT nasal spray   cetirizine (ZYRTEC) 10 MG tablet     Nervous and Auditory   Dysfunction of both eustachian tubes - Primary    In setting of sinusitis and allergies; start abx and intranasal steroid to assist      Relevant Medications   fluticasone (FLONASE) 50 MCG/ACT nasal spray   Return if symptoms worsen or fail to improve.     Vonna Kotyk, FNP, have reviewed all documentation for this visit. The documentation on 11/09/22 for the exam, diagnosis, procedures, and orders are all accurate and complete.  Gwyneth Sprout, [place] [phone] (phone) [phone] (fax)  [place]

## 2022-11-09 NOTE — Assessment & Plan Note (Signed)
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

## 2022-11-09 NOTE — Assessment & Plan Note (Signed)
Recommend daily antihistamine and intranasal steroid to assist

## 2022-11-09 NOTE — Assessment & Plan Note (Signed)
In setting of sinusitis and allergies; start abx and intranasal steroid to assist

## 2022-11-30 ENCOUNTER — Ambulatory Visit
Admission: RE | Admit: 2022-11-30 | Discharge: 2022-11-30 | Disposition: A | Discharge status: Home / Self Care | Payer: BC Managed Care – PPO | Source: Ambulatory Visit | Attending: Family Medicine | Admitting: Family Medicine

## 2022-11-30 DIAGNOSIS — Z1231 Encounter for screening mammogram for malignant neoplasm of breast: Secondary | ICD-10-CM | POA: Insufficient documentation

## 2022-12-01 NOTE — Progress Notes (Signed)
Hi Deborah Cole  Normal mammogram; repeat in 1 year.  Please let us know if you have any questions.  Thank you,  Terrica Duecker, FNP 

## 2023-01-28 ENCOUNTER — Other Ambulatory Visit: Payer: Self-pay | Admitting: Family Medicine

## 2023-01-31 ENCOUNTER — Encounter: Payer: Self-pay | Admitting: Family Medicine

## 2023-01-31 ENCOUNTER — Ambulatory Visit: Payer: BC Managed Care – PPO | Admitting: Family Medicine

## 2023-01-31 VITALS — BP 119/81 | HR 72 | Ht 62.0 in | Wt 191.3 lb

## 2023-01-31 DIAGNOSIS — M7542 Impingement syndrome of left shoulder: Secondary | ICD-10-CM | POA: Insufficient documentation

## 2023-01-31 DIAGNOSIS — M25812 Other specified joint disorders, left shoulder: Secondary | ICD-10-CM | POA: Diagnosis not present

## 2023-01-31 MED ORDER — MELOXICAM 7.5 MG PO TABS: 7.5000 mg | ORAL_TABLET | Freq: Every day | ORAL | 0 refills | Status: AC

## 2023-01-31 NOTE — Assessment & Plan Note (Signed)
Acute  Symptoms persistent over last 8 weeks  Will treat with meloxicam 7.5mg  daily for 2-4 weeks depending on improvement  Patient given rehab handout  Will have patient follow up in 1 month to see if improvement  Recommended imaging if no improvement in the next 2 weeks  We discussed options for referral for formal PT if no improvement with conservative measures

## 2023-01-31 NOTE — Patient Instructions (Addendum)
I have prescribed Meloxicam 7.5mg  daily for the next 2-4 weeks to help with the shoulder pain.   Please let me know if you see no improvement after 2 weeks as we may need to consider imaging to help with treatment.   Please avoid taking additional NSAIDS like ibuprofen, naproxen, advil or aleeve while on this medication    Secondary Shoulder Impingement Syndrome Rehab Ask your health care provider which exercises are safe for you. Do exercises exactly as told by your provider and adjust them as told. It is normal to feel mild stretching, pulling, tightness, or discomfort as you do these exercises. Stop right away if you feel sudden pain or your pain gets worse. Do not begin these exercises until told by your provider. Stretching and range-of-motion exercise This exercise warms up your muscles and joints. It also improves the movement and flexibility of your neck and shoulder. This exercise also helps to relieve pain and stiffness. Cervical side bend  Using good posture, sit on a stable chair or stand up. Without moving your shoulders, slowly tilt your left / right ear toward your left / right shoulder until you feel a stretch in your neck (cervical) muscles on the other side. You should be looking straight ahead. Hold for _______6___ seconds. Slowly return to the starting position. Repeat the stretch on your left / right side.  Strengthening exercises These exercises build strength and endurance in your shoulder. Endurance is the ability to use your muscles for a long time, even after they get tired. Scapular protraction, supine, isotonic  Lie on your back on a firm surface (supine position). Hold a ______2-5lb____ weight in your left / right hand. Raise your left / right arm straight into the air so your hand is directly above your shoulder joint. Push the weight into the air so your shoulder (scapula) lifts off the surface that you are lying on. The scapula will push up or forward  (protraction). Do not move your head, neck, or back. Hold for ____7-10______ seconds. Slowly return to the starting position. Let your muscles relax completely before you repeat this exercise.  Scapular retraction, isotonic  Sit in a stable chair without armrests or stand up. Secure an exercise band to a stable object in front of you so the band is at shoulder height. Hold one end of the exercise band in each hand. Squeeze your shoulder blades (scapulae) together and move your elbows slightly behind you (retraction). Do not shrug your shoulders upward while you do this. Hold for ____7-10______ seconds. Slowly return to the starting position.  Shoulder extension with scapular retraction, isotonic  Sit in a stable chair without armrests or stand up. Secure an exercise band to a stable object in front of you so the band is above shoulder height. Hold one end of the exercise band in each hand. Straighten your elbows and lift your hands up to shoulder height. Squeeze your shoulder blades together (scapular retraction) and pull your hands down to the sides of your thighs (shoulder extension). Stop when your hands are straight down by your sides. Do not let your hands go behind your body. Hold for ____7-10______ seconds. Slowly return to the starting position.  Shoulder abduction, isotonic    Document Revised: 03/31/2022 Document Reviewed: 03/17/2022 Elsevier Patient Education  2024 [employer].

## 2023-01-31 NOTE — Assessment & Plan Note (Signed)
>>  ASSESSMENT AND PLAN FOR SHOULDER IMPINGEMENT, LEFT WRITTEN ON 01/31/2023  9:07 AM BY SIMMONS-ROBINSON, Tawanna Cooler, MD  Acute  Symptoms persistent over last 8 weeks  Will treat with meloxicam 7.5mg  daily for 2-4 weeks depending on improvement  Patient given rehab handout  Will have patient follow up in 1 month to see if improvement  Recommended imaging if no improvement in the next 2 weeks  We discussed options for referral for formal PT if no improvement with conservative measures

## 2023-01-31 NOTE — Progress Notes (Signed)
I,Sha'taria Tyson,acting as a Neurosurgeon for [employer], MD.,have documented all relevant documentation on the behalf of Ronnald Ramp, MD,as directed by  Ronnald Ramp, MD while in the presence of Ronnald Ramp, MD.   Established patient visit   Patient: Deborah Cole   DOB: 1965/10/09   57 y.o. Female  MRN: [id number] Visit Date: 01/31/2023  Today's healthcare provider: Ronnald Ramp, MD   No chief complaint on file.  Subjective    HPI   Left Shoulder Pain    Patient complaints of left shoulder pain. This is evaluated as a personal injury. The pain is described as stabbing and radiating .  The onset of the pain was  2 months ago .  The pain occurs continuously and worse when position herself certain ways  and lasts all day.  Symptoms are aggravated by all activities. Symptoms are diminished by  medication: Tylenol that gives minor relief until she moves again.  Limited activities include: dressing self. No stiffness, no weakness, no swelling is reported. 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     Medications: Outpatient Medications Prior to Visit  Medication Sig   cetirizine (ZYRTEC) 10 MG tablet Take 1 tablet (10 mg total) by mouth daily.   fluticasone (FLONASE) 50 MCG/ACT nasal spray Place 2 sprays into both nostrils daily.   Multiple Vitamin tablet Take by mouth.   amoxicillin-clavulanate (AUGMENTIN) 875-125 MG tablet Take 1 tablet by mouth 2 (two) times daily. (Patient not taking: Reported on 01/31/2023)   No facility-administered medications prior to visit.    Review of Systems     Objective    BP 119/81 (BP Location: Right Arm, Patient Position: Sitting, Cuff Size: Normal)   Pulse 72   Ht 5\' 2"  (1.575 m)   Wt 191 lb 4.8 oz (86.8 kg)   BMI 34.99 kg/m    Physical Exam  Shoulder: Inspection reveals no obvious deformity, atrophy, or asymmetry. No bruising. No swelling Palpation is normal with  no TTP over [facility] joint or bicipital groove. Full ROM in flexion, abduction, internal/ pain with external rotation NV intact distally Normal scapular function observed. Special Tests:  - Impingement: +Hawkins, neers, empty can sign. - Supraspinatous: Negative empty can.  5/5 strength with resisted flexion at 20 degrees - Infraspinatous/Teres Minor: 5/5 strength with ER. +pain with resisted ER - Subscapularis:  5/5 strength with IR, Negative pain with resisted IR - Biceps tendon: Negative Speeds, Yerrgason's  - Glenohumeral joint Laxity: negative sulcus test - AC Joint: Negative cross arm - No painful arc and no drop arm sign   No results found for any visits on 01/31/23.   Assessment & Plan     Problem List Items Addressed This Visit       Other   Shoulder impingement, left - Primary    Acute  Symptoms persistent over last 8 weeks  Will treat with meloxicam 7.5mg  daily for 2-4 weeks depending on improvement  Patient given rehab handout  Will have patient follow up in 1 month to see if improvement  Recommended imaging if no improvement in the next 2 weeks  We discussed options for referral for formal PT if no improvement with conservative measures      Relevant Medications   meloxicam (MOBIC) 7.5 MG tablet     Return in about 4 weeks (around 02/28/2023) for left shoulder pain .        The entirety of the information documented in the History of  Present Illness, Review of Systems and Physical Exam were personally obtained by me. Portions of this information were initially documented by Sha'taria Tyson,CMA. I, Ronnald Ramp, MD have reviewed the documentation above for thoroughness and accuracy.      Ronnald Ramp, MD  [facility] [phone] (phone) [phone] (fax)  [facility]

## 2023-02-21 ENCOUNTER — Ambulatory Visit: Payer: BC Managed Care – PPO | Admitting: Family Medicine

## 2023-02-21 ENCOUNTER — Ambulatory Visit: Payer: Self-pay | Admitting: *Deleted

## 2023-02-21 VITALS — BP 118/78 | HR 71 | Ht 62.0 in | Wt 193.3 lb

## 2023-02-21 DIAGNOSIS — M66312 Spontaneous rupture of flexor tendons, left shoulder: Secondary | ICD-10-CM

## 2023-02-21 DIAGNOSIS — M25812 Other specified joint disorders, left shoulder: Secondary | ICD-10-CM

## 2023-02-21 MED ORDER — PREDNISONE 10 MG PO TABS: ORAL_TABLET | ORAL | 0 refills | Status: DC

## 2023-02-21 MED ORDER — DIAZEPAM 5 MG PO TABS: 5.0000 mg | ORAL_TABLET | Freq: Four times a day (QID) | ORAL | 0 refills | Status: DC | PRN

## 2023-02-21 NOTE — Progress Notes (Signed)
Established patient visit   Patient: Deborah Cole   DOB: September 20, 1965   57 y.o. Female  MRN: [id number] Visit Date: 02/21/2023  Today's healthcare provider: Jacky Kindle, FNP   Chief Complaint  Patient presents with   Shoulder Pain    Patient was seen on 01/31/23 and was treated with meloxicam 7.5mg  daily for 2-4 weeks depending on improvement. Was given rehab handout and advised f/u in 1 month to see if improvement as well as recommended imaging if no improvement in the next 2 weeks. Patient reports things have not gotten better/and her pain is a  with limited range of motion. Reports she has the pain when moving her shoulder to do anything.     Subjective    Shoulder Pain    HPI     Shoulder Pain    Additional comments: Patient was seen on 01/31/23 and was treated with meloxicam 7.5mg  daily for 2-4 weeks depending on improvement. Was given rehab handout and advised f/u in 1 month to see if improvement as well as recommended imaging if no improvement in the next 2 weeks. Patient reports things have not gotten better/and her pain is a  with limited range of motion. Reports she has the pain when moving her shoulder to do anything.        Last edited by Acey Lav, CMA on 02/21/2023  1:32 PM.       Medications: Outpatient Medications Prior to Visit  Medication Sig   cetirizine (ZYRTEC) 10 MG tablet Take 1 tablet (10 mg total) by mouth daily.   fluticasone (FLONASE) 50 MCG/ACT nasal spray Place 2 sprays into both nostrils daily.   meloxicam (MOBIC) 7.5 MG tablet Take 1 tablet (7.5 mg total) by mouth daily.   Multiple Vitamin tablet Take by mouth.   No facility-administered medications prior to visit.    Review of Systems    Objective    BP 118/78 (BP Location: Right Arm, Patient Position: Sitting, Cuff Size: Normal)   Pulse 71   Ht 5\' 2"  (1.575 m)   Wt 193 lb 4.8 oz (87.7 kg)   SpO2 100%   BMI 35.36 kg/m   Physical Exam Vitals and nursing note reviewed.   Constitutional:      General: She is not in acute distress.    Appearance: Normal appearance. She is obese. She is not ill-appearing, toxic-appearing or diaphoretic.  HENT:     Head: Normocephalic and atraumatic.  Cardiovascular:     Rate and Rhythm: Normal rate and regular rhythm.     Pulses: Normal pulses.     Heart sounds: Normal heart sounds. No murmur heard.    No friction rub. No gallop.  Pulmonary:     Effort: Pulmonary effort is normal. No respiratory distress.     Breath sounds: Normal breath sounds. No stridor. No wheezing, rhonchi or rales.  Chest:     Chest wall: No tenderness.  Abdominal:     General: Bowel sounds are normal.     Palpations: Abdomen is soft.  Musculoskeletal:        General: No swelling, deformity or signs of injury.     Left shoulder: Tenderness present. Decreased range of motion. Decreased strength.       Arms:     Right lower leg: No edema.     Left lower leg: No edema.     Comments: C/f muscle/tendon involvement  Skin:    General: Skin is warm and dry.  Capillary Refill: Capillary refill takes less than 2 seconds.     Coloration: Skin is not jaundiced or pale.     Findings: No bruising, erythema, lesion or rash.  Neurological:     General: No focal deficit present.     Mental Status: She is alert and oriented to person, place, and time. Mental status is at baseline.     Cranial Nerves: No cranial nerve deficit.     Sensory: No sensory deficit.     Motor: No weakness.     Coordination: Coordination normal.  Psychiatric:        Mood and Affect: Mood normal.        Behavior: Behavior normal.        Thought Content: Thought content normal.        Judgment: Judgment normal.      No results found for any visits on 02/21/23.  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    C/f- recommend MRI and sports med  referral Continue NSAIDs/APAP/Ice/stretching Recommend stabilization with sling Referral placed Recommend 2 week steroid taper        Other   Shoulder impingement, left - Primary    Acute on chronic; not improved Symptoms persistent over last ~12 weeks; seen by another provider on 6/17- no relieve with mobic 7.5 mg x 3 weeks       Relevant Medications   predniSONE (DELTASONE) 10 MG tablet   diazepam (VALIUM) 5 MG tablet   Other Relevant Orders   MR Shoulder Left Wo Contrast   Ambulatory referral to Sports Medicine  4+ strength pushing up L hand/5 r hand; R hand dominant   No follow-ups on file.     Leilani Merl, FNP, have reviewed all documentation for this visit. The documentation on 02/21/23 for the exam, diagnosis, procedures, and orders are all accurate and complete.  Jacky Kindle, FNP  [facility] [phone] (phone) [phone] (fax)  [facility]

## 2023-02-21 NOTE — Telephone Encounter (Signed)
  Chief Complaint: Shoulder pain Symptoms: Seen 01/31/23, no improvement, limited ROM Frequency: Seen 6/17 Pertinent Negatives: Patient denies  Disposition: [] ED /[] Urgent Care (no appt availability in office) / [x] Appointment(In office/virtual)/ []  [facility]/ [] Home Care/ [] Refused Recommended Disposition /[] [facility]/ []  Follow-up with PCP Additional Notes: Pt told to F/U if no improvement with anti-inflammatories. States "Not any worse, just no better."  Appt secured for today. Care advised provided, pt verbalizes understanding.  Reason for Disposition  [1] Unable to use arm at all AND [2] because of shoulder pain or stiffness  Answer Assessment - Initial Assessment Questions 1. ONSET: "When did the pain start?"     See 01/31/23 2. LOCATION: "Where is the pain located?"     Left shoulder 3. PAIN: "How bad is the pain?" (Scale 1-10; or mild, moderate, severe)   - MILD (1-3): doesn't interfere with normal activities   - MODERATE (4-7): interferes with normal activities (e.g., work or school) or awakens from sleep   - SEVERE (8-10): excruciating pain, unable to do any normal activities, unable to move arm at all due to pain     7/10 4. WORK OR EXERCISE: "Has there been any recent work or exercise that involved this part of the body?"     no       6. OTHER SYMPTOMS: "Do you have any other symptoms?" (e.g., neck pain, swelling, rash, fever, numbness, weakness)     No  Protocols used: Shoulder Pain-A-AH

## 2023-02-21 NOTE — Assessment & Plan Note (Signed)
Acute on chronic; not improved Symptoms persistent over last ~12 weeks; seen by another provider on 6/17- no relieve with mobic 7.5 mg x 3 weeks

## 2023-02-21 NOTE — Assessment & Plan Note (Signed)
>>  ASSESSMENT AND PLAN FOR SHOULDER IMPINGEMENT, LEFT WRITTEN ON 02/21/2023  1:59 PM BY PAYNE, ELISE T, FNP  Acute on chronic; not improved Symptoms persistent over last ~12 weeks; seen by another provider on 6/17- no relieve with mobic 7.5 mg x 3 weeks

## 2023-02-21 NOTE — Assessment & Plan Note (Signed)
C/f- recommend MRI and sports med referral Continue NSAIDs/APAP/Ice/stretching Recommend stabilization with sling Referral placed Recommend 2 week steroid taper

## 2023-02-23 ENCOUNTER — Ambulatory Visit: Payer: BC Managed Care – PPO | Admitting: Family Medicine

## 2023-02-23 ENCOUNTER — Encounter: Payer: Self-pay | Admitting: Family Medicine

## 2023-02-23 VITALS — BP 120/70 | HR 80 | Ht 62.0 in | Wt 192.0 lb

## 2023-02-23 DIAGNOSIS — M7542 Impingement syndrome of left shoulder: Secondary | ICD-10-CM

## 2023-02-23 MED ORDER — DICLOFENAC SODIUM 75 MG PO TBEC
75.0000 mg | DELAYED_RELEASE_TABLET | Freq: Two times a day (BID) | ORAL | 0 refills | Status: DC | PRN
Start: 2023-02-23 — End: 2023-03-22

## 2023-02-25 ENCOUNTER — Encounter: Payer: Self-pay | Admitting: Family Medicine

## 2023-02-25 NOTE — Patient Instructions (Signed)
-  Proceed with MRI as scheduled -Transition to diclofenac regiment - Home-based rehab once symptoms allow - Close follow-up if symptoms fail to progress with low threshold to advance to corticosteroid injection, modification of pharmacotherapy, formal PT, close clinically guided

## 2023-02-25 NOTE — Assessment & Plan Note (Signed)
35-month history of atraumatic left shoulder pain, interfering with ADLs, has been on meloxicam course but limited improvement.  Examination with full, painless range of motion, focality to the supraspinatus where there is 5 -/5 strength, pain, external rotation with 5/5 strength, pain, internal rotation benign, positive impingement, remainder of provocative testing benign.  Does have upcoming MRI ordered by her PCP.  Patient's clinical history and features are most consistent with rotator cuff related pain syndrome with focality to the supraspinatus, cannot exclude element of partial tearing.  This was conveyed to the patient, plan as follows:  -Proceed with MRI as scheduled -Transition to diclofenac regiment - Home-based rehab once symptoms allow - Close follow-up if symptoms fail to progress with low threshold to advance to corticosteroid injection, modification of pharmacotherapy, formal PT, close clinically guided

## 2023-02-25 NOTE — Progress Notes (Signed)
     Primary Care / Sports Medicine Office Visit  Patient Information:  Patient ID: NHI PIVONKA, female DOB: Jun 25, 1966 Age: 57 y.o. MRN: [id number]   Deborah Cole is a pleasant 57 y.o. female presenting with the following:  Chief Complaint  Patient presents with   Shoulder Pain    Left for 4 months, has MRI schedule 7/15    Vitals:   02/23/23 0913  BP: 120/70  Pulse: 80  SpO2: 98%   Vitals:   02/23/23 0913  Weight: 192 lb (87.1 kg)  Height: 5\' 2"  (1.575 m)   Body mass index is 35.12 kg/m.  No results found.   Independent interpretation of notes and tests performed by another provider:   None  Procedures performed:   None  Pertinent History, Exam, Impression, and Recommendations:   [name] was seen today for shoulder pain.  Rotator cuff impingement syndrome, left Assessment & Plan: 46-month history of atraumatic left shoulder pain, interfering with ADLs, has been on meloxicam course but limited improvement.  Examination with full, painless range of motion, focality to the supraspinatus where there is 5 -/5 strength, pain, external rotation with 5/5 strength, pain, internal rotation benign, positive impingement, remainder of provocative testing benign.  Does have upcoming MRI ordered by her PCP.  Patient's clinical history and features are most consistent with rotator cuff related pain syndrome with focality to the supraspinatus, cannot exclude element of partial tearing.  This was conveyed to the patient, plan as follows:  -Proceed with MRI as scheduled -Transition to diclofenac regiment - Home-based rehab once symptoms allow - Close follow-up if symptoms fail to progress with low threshold to advance to corticosteroid injection, modification of pharmacotherapy, formal PT, close clinically guided  Orders: -     Diclofenac Sodium; Take 1 tablet (75 mg total) by mouth 2 (two) times daily as needed. Start after steroids.  Dispense: 30 tablet; Refill:  0     Orders & Medications Meds ordered this encounter  Medications   diclofenac (VOLTAREN) 75 MG EC tablet    Sig: Take 1 tablet (75 mg total) by mouth 2 (two) times daily as needed. Start after steroids.    Dispense:  30 tablet    Refill:  0   No orders of the defined types were placed in this encounter.    No follow-ups on file.     Jerrol Banana, MD, [facility]   Primary Care Sports Medicine Primary Care and Sports Medicine at [facility]

## 2023-02-25 NOTE — Telephone Encounter (Signed)
Please review.  KP

## 2023-02-28 ENCOUNTER — Ambulatory Visit: Payer: BC Managed Care – PPO

## 2023-03-02 IMAGING — MG MM DIGITAL DIAGNOSTIC UNILAT*L* W/ TOMO W/ CAD
4 series · 4 of 12 positions shown · non-contrast
Comparison: Previous exam(s).

CLINICAL DATA: Left subareolar breast asymmetry seen on most recent
screening mammography.

EXAM:
DIGITAL DIAGNOSTIC UNILATERAL LEFT MAMMOGRAM WITH TOMOSYNTHESIS AND
CAD; ULTRASOUND LEFT BREAST LIMITED
TECHNIQUE: Left digital diagnostic mammography and breast tomosynthesis was
performed. The images were evaluated with computer-aided detection.;
Targeted ultrasound examination of the left breast was performed.

[L MLO synth-2D]
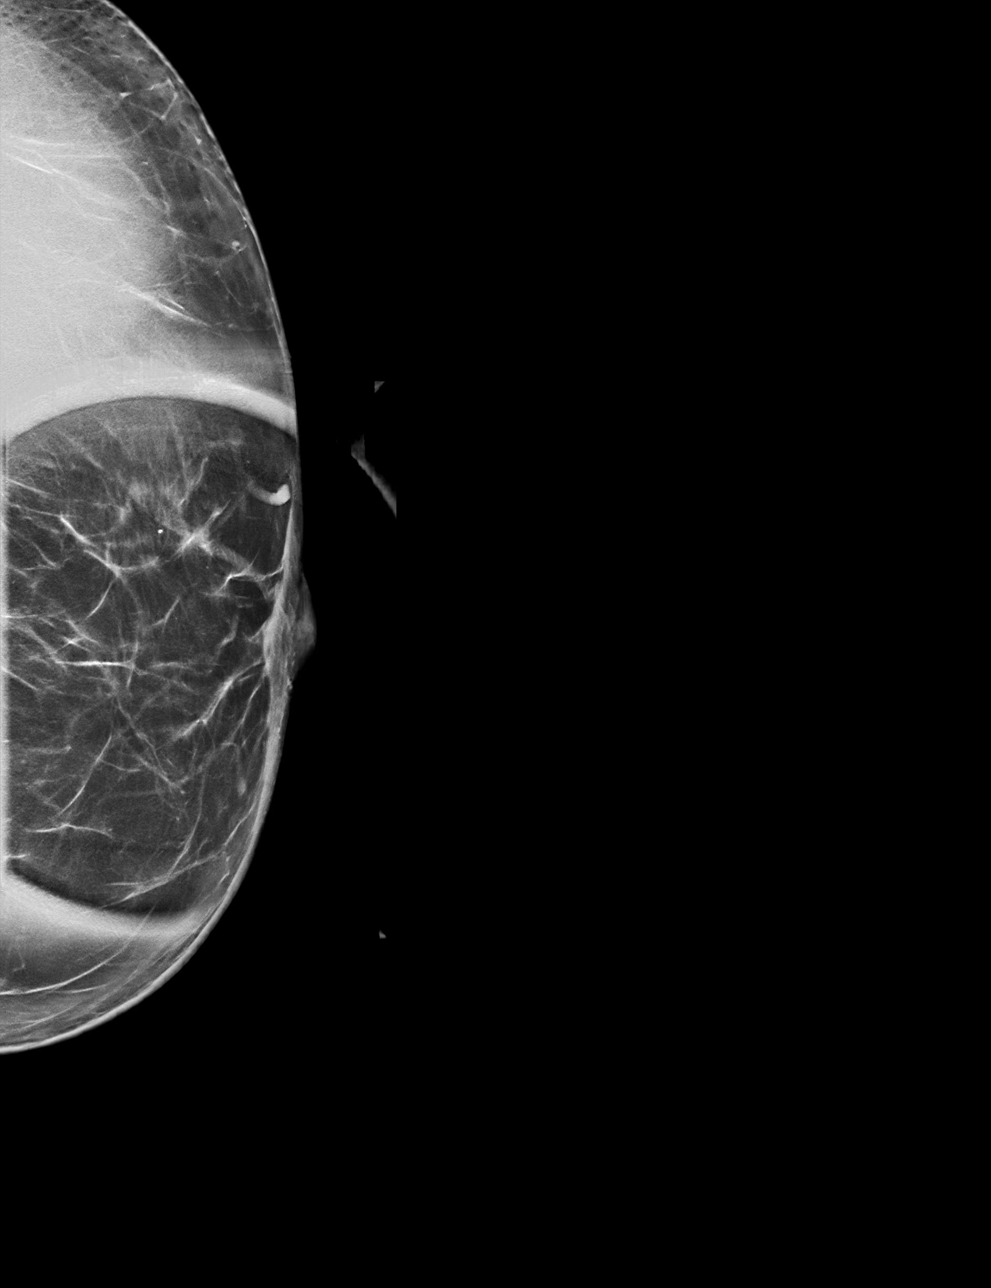

[L CC synth-2D]
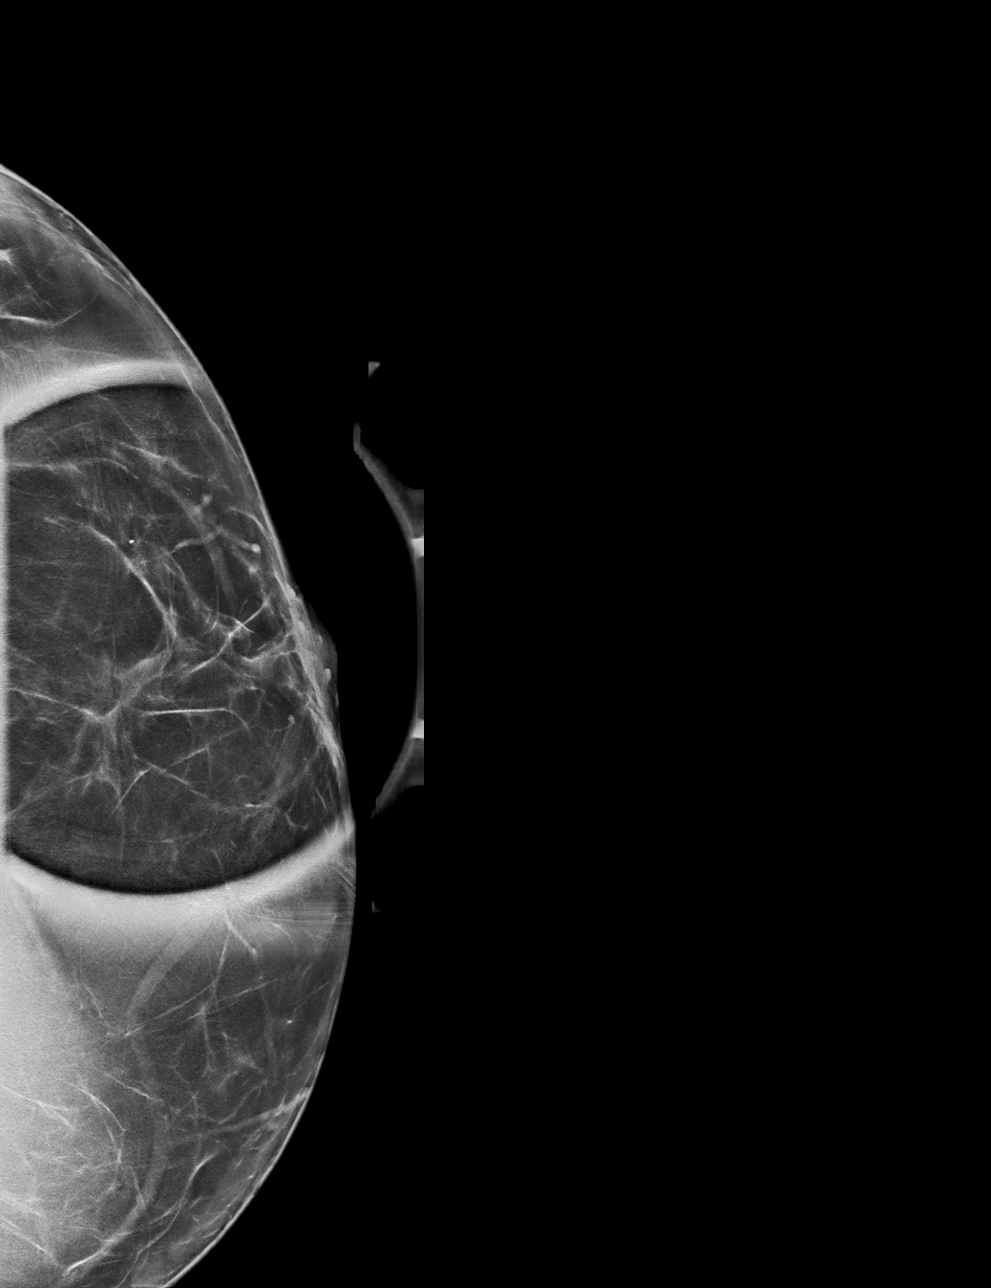

[L CC tomo · tomo slice 33/65.0]
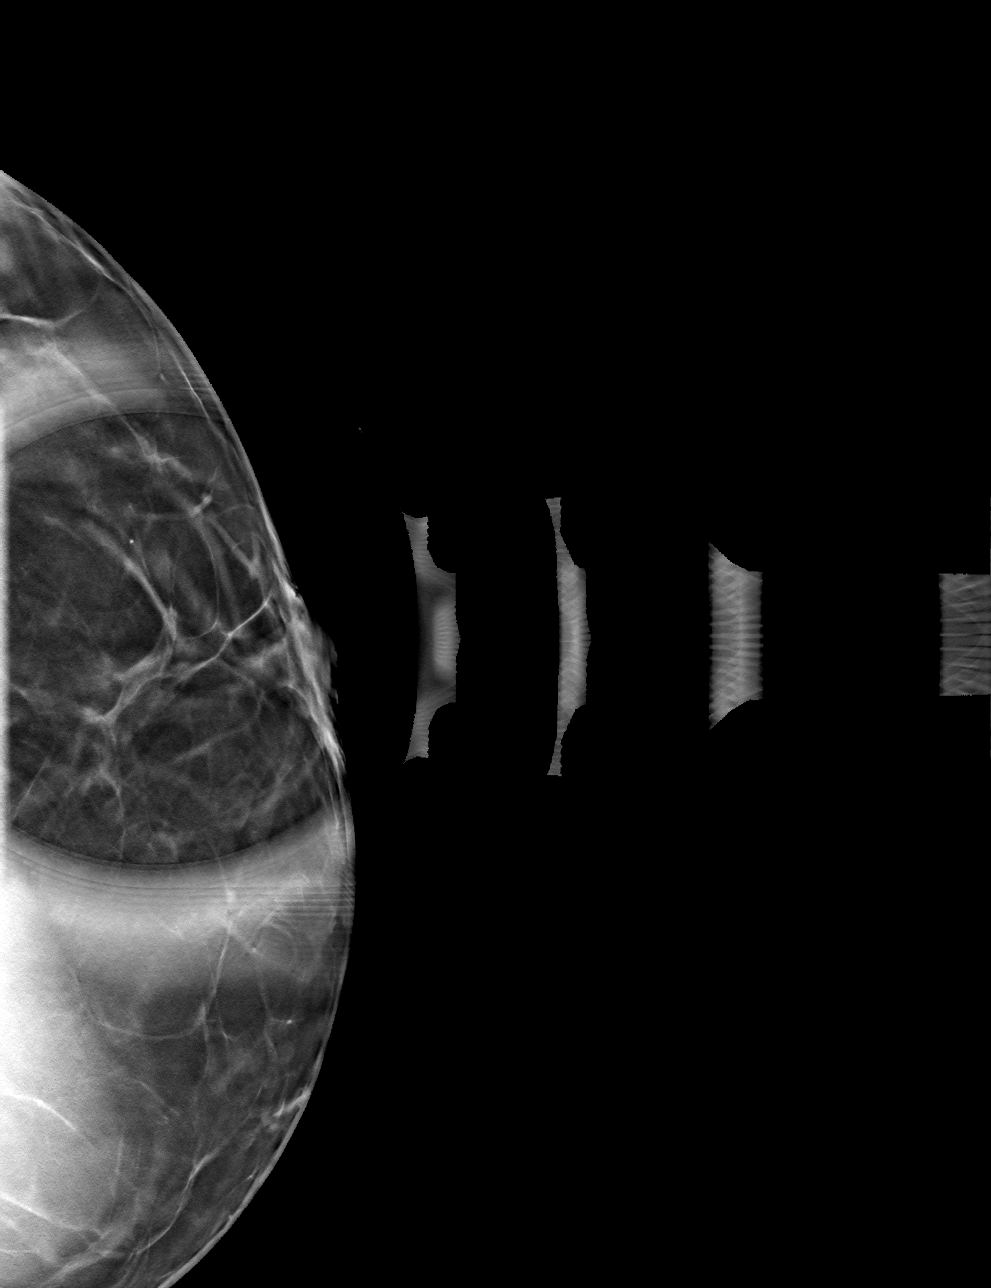

[L MLO tomo · tomo slice 37/74.0]
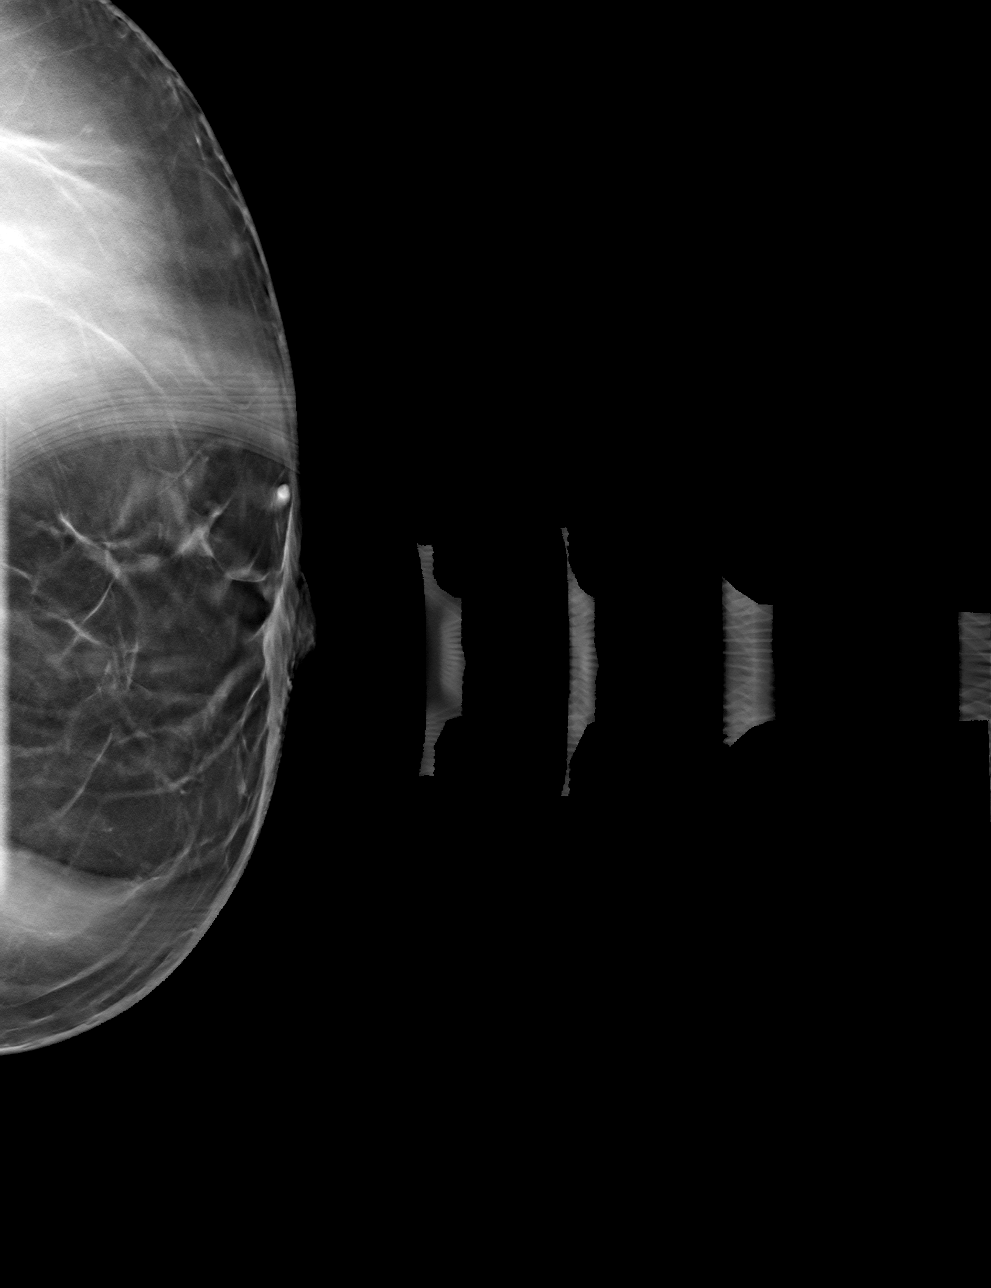

[4 of 12 positions shown; findings below may reference images not displayed]

ACR Breast Density Category b: There are scattered areas of
fibroglandular density.
FINDINGS: Additional mammographic views of the left breast demonstrate no
suspicious masses or areas of architectural distortion. The
previously questioned asymmetry in the subareolar left breast
effaces to glandular tissue.

Targeted left subareolar breast ultrasound is performed
demonstrating no suspicious masses or shadowing lesions.
IMPRESSION: No mammographic or sonographic evidence of left breast malignancy.

RECOMMENDATION:
Screening mammogram in one year.(Code:4B-6-L6N)

I have discussed the findings and recommendations with the patient.
If applicable, a reminder letter will be sent to the patient
regarding the next appointment.

BI-RADS CATEGORY  1: Negative.

## 2023-03-10 ENCOUNTER — Telehealth: Payer: Self-pay

## 2023-03-10 NOTE — Telephone Encounter (Signed)
Jacky Kindle, FNP  Encourage patient to follow up with sports medicine and [facility]; insurance is requesting peer to peer at this time. Prefer patient sees specialist prior to completion.  Patient advised. I also reviewed message from Dr. Ashley Royalty office. She will fu with them.

## 2023-03-22 ENCOUNTER — Ambulatory Visit: Payer: BC Managed Care – PPO | Admitting: Family Medicine

## 2023-03-22 ENCOUNTER — Encounter: Payer: Self-pay | Admitting: Family Medicine

## 2023-03-22 VITALS — BP 122/78 | HR 68 | Ht 62.0 in | Wt 195.0 lb

## 2023-03-22 DIAGNOSIS — M7542 Impingement syndrome of left shoulder: Secondary | ICD-10-CM | POA: Diagnosis not present

## 2023-03-22 MED ORDER — DICLOFENAC SODIUM 75 MG PO TBEC
75.0000 mg | DELAYED_RELEASE_TABLET | Freq: Two times a day (BID) | ORAL | 0 refills | Status: DC | PRN
Start: 2023-03-22 — End: 2023-03-22

## 2023-03-22 MED ORDER — DICLOFENAC SODIUM 75 MG PO TBEC
75.0000 mg | DELAYED_RELEASE_TABLET | Freq: Two times a day (BID) | ORAL | 0 refills | Status: AC | PRN
Start: 2023-03-22 — End: ?

## 2023-03-22 NOTE — Patient Instructions (Signed)
-   Transition to as needed dosing of diclofenac (take with food) - Start formal physical therapy, referral coordinator will contact you to schedule - Contact us after PT course completes, or sooner, if symptoms worsen/fail to improve to discuss next steps

## 2023-03-22 NOTE — Progress Notes (Signed)
     Primary Care / Sports Medicine Office Visit  Patient Information:  Patient ID: Deborah Cole, female DOB: June 05, 1966 Age: 57 y.o. MRN: [id number]   Deborah Cole is a pleasant 57 y.o. female presenting with the following:  Chief Complaint  Patient presents with   Rotator cuff impingement syndrome, left    Vitals:   03/22/23 0854  BP: 122/78  Pulse: 68  SpO2: 98%   Vitals:   03/22/23 0854  Weight: 195 lb (88.5 kg)  Height: 5\' 2"  (1.575 m)   Body mass index is 35.67 kg/m.  No results found.   Independent interpretation of notes and tests performed by another provider:   None  Procedures performed:   None  Pertinent History, Exam, Impression, and Recommendations:   Deborah Cole was seen today for rotator cuff impingement syndrome, left.  Rotator cuff impingement syndrome, left Assessment & Plan: Interim improvement in symptoms with limited interference with ADLs, has required medications 4 out of the 8 prior days, overall progress reported.  Examination shows interim improvement though persistent focality to the left supraspinatus, now equivocal impingement features, otherwise negative provocative testing.  We reviewed the various treatment strategies and given interim progress and residual symptoms both from a surgical and nonsurgical standpoint.  Patient wishes to proceed in a nonsurgical manner.  Plan: - Transition to as needed dosing of diclofenac (take with food) - Start formal physical therapy, referral coordinator will contact you to schedule - Contact us after PT course completes, or sooner, if symptoms worsen/fail to improve - If noted, revisit advanced imaging, subacromial corticosteroid injection, and medication management options to be considered  Orders: -     Ambulatory referral to Physical Therapy -     Diclofenac Sodium; Take 1 tablet (75 mg total) by mouth 2 (two) times daily as needed.  Dispense: 60 tablet; Refill: 0     Orders &  Medications Meds ordered this encounter  Medications   DISCONTD: diclofenac (VOLTAREN) 75 MG EC tablet    Sig: Take 1 tablet (75 mg total) by mouth 2 (two) times daily as needed. Start after steroids.    Dispense:  60 tablet    Refill:  0   diclofenac (VOLTAREN) 75 MG EC tablet    Sig: Take 1 tablet (75 mg total) by mouth 2 (two) times daily as needed.    Dispense:  60 tablet    Refill:  0   Orders Placed This Encounter  Procedures   Ambulatory referral to Physical Therapy     No follow-ups on file.     Jerrol Banana, MD, [facility]   Primary Care Sports Medicine Primary Care and Sports Medicine at [facility]

## 2023-03-22 NOTE — Assessment & Plan Note (Addendum)
Interim improvement in symptoms with limited interference with ADLs, has required medications 4 out of the 8 prior days, overall progress reported.  Examination shows interim improvement though persistent focality to the left supraspinatus, now equivocal impingement features, otherwise negative provocative testing.  We reviewed the various treatment strategies and given interim progress and residual symptoms both from a surgical and nonsurgical standpoint.  Patient wishes to proceed in a nonsurgical manner.  Plan: - Transition to as needed dosing of diclofenac (take with food) - Start formal physical therapy, referral coordinator will contact you to schedule - Contact us after PT course completes, or sooner, if symptoms worsen/fail to improve - If noted, revisit advanced imaging, subacromial corticosteroid injection, and medication management options to be considered

## 2023-04-05 ENCOUNTER — Encounter: Payer: Self-pay | Admitting: Physical Therapy

## 2023-04-05 ENCOUNTER — Ambulatory Visit: Payer: BC Managed Care – PPO

## 2023-04-05 ENCOUNTER — Ambulatory Visit: Payer: BC Managed Care – PPO | Attending: Family Medicine

## 2023-04-05 DIAGNOSIS — M25512 Pain in left shoulder: Secondary | ICD-10-CM | POA: Insufficient documentation

## 2023-04-05 DIAGNOSIS — M6281 Muscle weakness (generalized): Secondary | ICD-10-CM | POA: Insufficient documentation

## 2023-04-05 DIAGNOSIS — M7542 Impingement syndrome of left shoulder: Secondary | ICD-10-CM | POA: Insufficient documentation

## 2023-04-05 NOTE — Therapy (Signed)
OUTPATIENT PHYSICAL THERAPY SHOULDER EVALUATION   Patient Name: Deborah Cole MRN: [id number] DOB:10-18-1965, 57 y.o., female Today's Date: 04/05/2023  END OF SESSION:  PT End of Session - 04/05/23 1645     Visit Number 1    Number of Visits 13    Date for PT Re-Evaluation 05/17/23    Progress Note Due on Visit 10    PT Start Time 1646    PT Stop Time 1729    PT Time Calculation (min) 43 min    Activity Tolerance Patient tolerated treatment well    Behavior During Therapy [facility] for tasks assessed/performed             Past Medical History:  Diagnosis Date   Acute stress disorder 01/05/2016   Anxiety    Cancer (HCC)    melanoma   Depression 02/18/2015   Polyp of colon    Vitamin D deficiency    Past Surgical History:  Procedure Laterality Date   CESAREAN SECTION     COLONOSCOPY WITH PROPOFOL N/A 03/05/2020   Procedure: COLONOSCOPY WITH PROPOFOL;  Surgeon: Pasty Spillers, MD;  Location: [facility] ENDOSCOPY;  Service: Endoscopy;  Laterality: N/A;   History of Melanoma excision  2005   Malignant   S/P bunionectomy  1984   TUBAL LIGATION     Patient Active Problem List   Diagnosis Date Noted   Nontraumatic rupture of long head of biceps tendon of left shoulder 02/21/2023   Rotator cuff impingement syndrome, left 01/31/2023   Dysfunction of both eustachian tubes 11/09/2022   Acute non-recurrent pansinusitis 11/09/2022   Seasonal allergic rhinitis due to pollen 11/09/2022   Annual physical exam 09/13/2022   Elevated LDL cholesterol level 09/13/2022   Breast cancer screening by mammogram 05/13/2021   Obesity (BMI 30.0-34.9) 05/13/2021   History of melanoma 11/27/2008    PCP: Jacky Kindle, FNP  REFERRING PROVIDER: Jerrol Banana, MD  REFERRING DIAG: [phone] (ICD-10-CM) - Rotator cuff impingement syndrome, left  THERAPY DIAG:  No diagnosis found.  Rationale for Evaluation and Treatment: Rehabilitation  ONSET DATE: 10/2022  SUBJECTIVE:                                                                                                                                                                                       SUBJECTIVE STATEMENT: Patient reports L shoulder pain and difficulty with certain ideas. Patient reports it began at the end of school year and was having difficulty getting dressed. Reports it was a gradual progression of difficulty and pain with no MOI. Patient reports was having numbness into the posterior aspect of hand but after prednisone this disappeared. Reports  clicking and popping in L shoulder.  Difficulty reaching overhead, shutting car door, carrying bags on L shoulder, sleeping on L side, and hooking bra Hand dominance: Right  PERTINENT HISTORY: Per MD note on 01/31/23, patient complaints of left shoulder pain. This is evaluated as a personal injury. The pain is described as stabbing and radiating. The onset of the pain was  2 months ago. The pain occurs continuously and worse when position herself certain ways  and lasts all day.  Symptoms are aggravated by all activities. Symptoms are diminished by  medication: Tylenol that gives minor relief until she moves again.  Limited activities include: dressing self. No stiffness, no weakness, no swelling is reported. 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  Per MD note on 03/22/23, examination shows interim improvement though persistent focality to the left supraspinatus, now equivocal impingement features, otherwise negative provocative testing.   PAIN:  Are you having pain? Yes: NPRS scale: 5/10 at worst  Pain location: L shoulder  Pain description: aching  Aggravating factors: sleeping on L side, lifting, dressing, shutting the door  Relieving factors: medication, avoiding aggravating motions   PRECAUTIONS: None  RED FLAGS: None   WEIGHT BEARING RESTRICTIONS: No  FALLS:  Has patient fallen in last 6 months? 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   PLOF: Independent  PATIENT GOALS:to be without pain and dress self without pain   NEXT MD VISIT: not scheduled but MD requests to follow up after a couple of weeks of PT   OBJECTIVE:   DIAGNOSTIC FINDINGS:  N/A  PATIENT SURVEYS:  FOTO 63 with goal of 64  COGNITION: Overall cognitive status: Within functional limits for tasks assessed     SENSATION: WFL  POSTURE: Forward head, rounded shoulders   UPPER EXTREMITY ROM:   Cervical spine ruled out with Spurling's test - no provocation of pain  Lumbar spine ruled out with lumbar quadrant test    Active ROM Right eval Left eval  Shoulder flexion [facility]  Shoulder extension [facility]   Shoulder abduction 180 105*  Shoulder adduction    Shoulder internal rotation 70 63  Shoulder external rotation 79 51*  (Blank rows = not tested)  UPPER EXTREMITY MMT:  MMT Right eval Left eval  Shoulder flexion 5 4+  Shoulder extension 4+ 4-  Shoulder abduction 5 3*  Shoulder adduction    Shoulder internal rotation 5 5*  Shoulder external rotation 5 4  Middle trapezius 5 4*  Lower trapezius 3 3*  Latissimus dorsi 4 4  Elbow flexion 5 5  Elbow extension 5 5  Wrist flexion    Wrist extension    Wrist ulnar deviation    Wrist radial deviation    Wrist pronation    Wrist supination    Grip strength (lbs)    (Blank rows = not tested)  SHOULDER SPECIAL TESTS: Impingement tests (on L): Neer impingement test: positive , Hawkins/Kennedy impingement test: positive , and Painful arc test: positive  Rotator cuff assessment: Empty can test: negative and Full can test: negative Biceps assessment: Speed's test: positive   JOINT MOBILITY TESTING:  N/A   PALPATION:  TTP at proximal biceps insertion     TODAY'S TREATMENT:  DATE: 04/05/23  HEP handout provided and instructed on technique.  PATIENT EDUCATION: Education details: HEP, POC, goals  Person educated: Patient Education method: Explanation, Demonstration, and Handouts Education comprehension: verbalized understanding and returned demonstration  HOME EXERCISE PROGRAM: Access Code: [email or web address] URL: [email or web address] Date: 04/05/2023 Prepared by: Maylon Peppers  Exercises - Standing Isometric Shoulder Internal Rotation at Doorway  - 2-3 x daily - 3-4 x weekly - 3 sets - 10 reps - Standing Isometric Shoulder External Rotation with Doorway  - 2-3 x daily - 3-4 x weekly - 3 sets - 10 reps - Standing Isometric Shoulder Abduction with Doorway  - 2-3 x daily - 3-4 x weekly - 3 sets - 10 reps - Standing Isometric Shoulder Flexion with Doorway  - 2-3 x daily - 3-4 x weekly - 3 sets - 10 reps - Shoulder Flexion Serratus Activation with Resistance  - 2-3 x daily - 3-4 x weekly - 3 sets - 10 reps  ASSESSMENT:  CLINICAL IMPRESSION: Patient is a 57 y.o. female who was seen today for physical therapy evaluation and treatment for L shoulder pain. Patient reports difficulty with tasks involving external rotation and abduction. Patient showing signs of L biceps tendinopathy along with impingement. Demonstrates L shoulder weakness with associated pain and decreased ROM. ***.   OBJECTIVE IMPAIRMENTS: decreased ROM, decreased strength, impaired UE functional use, and postural dysfunction.   ACTIVITY LIMITATIONS: carrying, lifting, dressing, and reach over head  PARTICIPATION LIMITATIONS: cleaning, laundry, occupation, and yard work  PERSONAL FACTORS: Age, Past/current experiences, and Profession are also affecting patient's functional outcome.   REHAB POTENTIAL: Good  CLINICAL DECISION  MAKING: Stable/uncomplicated  EVALUATION COMPLEXITY: Low   GOALS: Goals reviewed with patient? Yes  SHORT TERM GOALS: Target date: 04/26/2023  Patient will be independent in HEP to improve strength/mobility for better functional independence with ADLs. Baseline: 8/20: HEP initiated Goal status: INITIAL  2.  *** Baseline:  Goal status: INITIAL  3.  *** Baseline:  Goal status: INITIAL  4.  *** Baseline:  Goal status: INITIAL  5.  *** Baseline:  Goal status: INITIAL  6.  *** Baseline:  Goal status: INITIAL  LONG TERM GOALS: Target date: 05/17/2023  Patient will increase FOTO score to equal to or greater than 73 to demonstrate statistically significant improvement in mobility and quality of life.  Baseline: 8/20:  Goal status: INITIAL  2.  Patient will increase ***  Baseline:  Goal status: INITIAL  3.  *** Baseline:  Goal status: INITIAL  4.  *** Baseline:  Goal status: INITIAL  5.  *** Baseline:  Goal status: INITIAL  6.  *** Baseline:  Goal status: INITIAL  PLAN:  PT FREQUENCY: 1-2x/week  PT DURATION: 6 weeks  PLANNED INTERVENTIONS: Therapeutic exercises, Therapeutic activity, Neuromuscular re-education, Patient/Family education, Self Care, Joint mobilization, DME instructions, Spinal mobilization, Cryotherapy, Moist heat, and Manual therapy  PLAN FOR NEXT SESSION: HEP review, scapular and UE strengthening     Maylon Peppers, PT, DPT Physical Therapist - [place]  [facility]  04/05/2023, 4:47 PM

## 2023-04-12 ENCOUNTER — Ambulatory Visit: Payer: BC Managed Care – PPO

## 2023-04-12 ENCOUNTER — Encounter: Payer: Self-pay | Admitting: Physical Therapy

## 2023-04-12 DIAGNOSIS — M25512 Pain in left shoulder: Secondary | ICD-10-CM

## 2023-04-12 DIAGNOSIS — M6281 Muscle weakness (generalized): Secondary | ICD-10-CM

## 2023-04-12 NOTE — Therapy (Signed)
OUTPATIENT PHYSICAL THERAPY SHOULDER TREATMENT   Patient Name: Deborah Cole MRN: [id number] DOB:[birth year], 57 y.o., female Today's Date: 04/12/2023  END OF SESSION:  PT End of Session - 04/12/23 1806     Visit Number 2    Number of Visits 13    Date for PT Re-Evaluation 05/17/23    PT Start Time 1721    PT Stop Time 1801    PT Time Calculation (min) 40 min    Activity Tolerance Patient tolerated treatment well    Behavior During Therapy [facility] for tasks assessed/performed              Past Medical History:  Diagnosis Date   Acute stress disorder 01/05/2016   Anxiety    Cancer (HCC)    melanoma   Depression 02/18/2015   Polyp of colon    Vitamin D deficiency    Past Surgical History:  Procedure Laterality Date   CESAREAN SECTION     COLONOSCOPY WITH PROPOFOL N/A 03/05/2020   Procedure: COLONOSCOPY WITH PROPOFOL;  Surgeon: Pasty Spillers, MD;  Location: [facility] ENDOSCOPY;  Service: Endoscopy;  Laterality: N/A;   History of Melanoma excision  2005   Malignant   S/P bunionectomy  1984   TUBAL LIGATION     Patient Active Problem List   Diagnosis Date Noted   Nontraumatic rupture of long head of biceps tendon of left shoulder 02/21/2023   Rotator cuff impingement syndrome, left 01/31/2023   Dysfunction of both eustachian tubes 11/09/2022   Acute non-recurrent pansinusitis 11/09/2022   Seasonal allergic rhinitis due to pollen 11/09/2022   Annual physical exam 09/13/2022   Elevated LDL cholesterol level 09/13/2022   Breast cancer screening by mammogram 05/13/2021   Obesity (BMI 30.0-34.9) 05/13/2021   History of melanoma 11/27/2008    PCP: Jacky Kindle, FNP  REFERRING PROVIDER: Jerrol Banana, MD  REFERRING DIAG: [phone] (ICD-10-CM) - Rotator cuff impingement syndrome, left  THERAPY DIAG:  Muscle weakness (generalized)  Acute pain of left shoulder  Rationale for Evaluation and Treatment: Rehabilitation  ONSET DATE: 10/2022  SUBJECTIVE:                                                                                                                                                                                       SUBJECTIVE STATEMENT: Patient reports L shoulder pain and difficulty with certain ideas. Patient reports it began at the end of school year and was having difficulty getting dressed. Reports it was a gradual progression of difficulty and pain with no MOI. Patient reports was having numbness into the posterior aspect of hand but after prednisone this disappeared. Reports clicking and  popping in L shoulder.  Difficulty reaching overhead, shutting car door, carrying bags on L shoulder, sleeping on L side, and hooking bra Hand dominance: Right  PERTINENT HISTORY: Per MD note on 01/31/23, patient complaints of left shoulder pain. This is evaluated as a personal injury. The pain is described as stabbing and radiating. The onset of the pain was  2 months ago. The pain occurs continuously and worse when position herself certain ways  and lasts all day.  Symptoms are aggravated by all activities. Symptoms are diminished by  medication: Tylenol that gives minor relief until she moves again.  Limited activities include: dressing self. No stiffness, no weakness, no swelling is reported. 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  Per MD note on 03/22/23, examination shows interim improvement though persistent focality to the left supraspinatus, now equivocal impingement features, otherwise negative provocative testing.   PAIN:  Are you having pain? Yes: NPRS scale: 5/10 at worst  Pain location: L shoulder  Pain description: aching  Aggravating factors: sleeping on L side, lifting, dressing, shutting the door  Relieving factors: medication, avoiding aggravating motions   PRECAUTIONS: None  RED FLAGS: None   WEIGHT BEARING RESTRICTIONS: No  FALLS:  Has patient fallen in last 6 months? 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   PLOF: Independent  PATIENT GOALS:to be without pain and dress self without pain   NEXT MD VISIT: not scheduled but MD requests to follow up after a couple of weeks of PT   OBJECTIVE:   DIAGNOSTIC FINDINGS:  N/A  PATIENT SURVEYS:  FOTO 63 with goal of 20  COGNITION: Overall cognitive status: Within functional limits for tasks assessed     SENSATION: WFL  POSTURE: Forward head, rounded shoulders   UPPER EXTREMITY ROM:   Cervical spine ruled out with Spurling's test - no provocation of pain  Lumbar spine ruled out with lumbar quadrant test    Active ROM Right eval Left eval  Shoulder flexion [facility]  Shoulder extension [facility]   Shoulder abduction 180 105*  Shoulder adduction    Shoulder internal rotation 70 63  Shoulder external rotation 79 51*  (Blank rows = not tested)  UPPER EXTREMITY MMT:  MMT Right eval Left eval  Shoulder flexion 5 4+  Shoulder extension 4+ 4-  Shoulder abduction 5 3*  Shoulder adduction    Shoulder internal rotation 5 5*  Shoulder external rotation 5 4  Middle trapezius 5 4*  Lower trapezius 3 3*  Latissimus dorsi 4 4  Elbow flexion 5 5  Elbow extension 5 5  (Blank rows = not tested)  SHOULDER SPECIAL TESTS: Impingement tests (on L): Neer impingement test: positive , Hawkins/Kennedy impingement test: positive , and Painful arc test: positive  Rotator cuff assessment: Empty can test: negative and Full can test: negative Biceps assessment: Speed's test: positive   JOINT MOBILITY TESTING:  N/A   PALPATION:  TTP at proximal biceps insertion    TODAY'S TREATMENT:  DATE: 04/12/23  Subjective: Pt reports L shoulder symptoms have improved since last session; she says pain is about 3/10 right now but she is not in constant pain. HEP going well at home.   Therex: HEP review - pt demonstrated proper form and good recall of all isometric exercises   Resisted Shoulder ER: red TB, 2x10 - no L shoulder pain  Standing Shoulder Horizontal Abduction: red TB, 2x10 - mild (4/10) L shoulder pain with end-range horizontal abd, pt cued to keep within pain-free range  Standing Shoulder Diagonals: red TB, 2x10, continued mild L shoulder pain with end-range abd, pt cued to keep within pain-free range  Resisted Wall Walks with Red TB around wrists: 2 laps each direction  Resisted Shoulder Rows/Scapular Retractions: red TB, 2x10 - no pain  Resisted Shoulder Extensions: red TB, 2x10 - no pain    PATIENT EDUCATION: Education details: HEP, POC, goals  Person educated: Patient Education method: Explanation, Demonstration, and Handouts Education comprehension: verbalized understanding and returned demonstration  HOME EXERCISE PROGRAM: Access Code: [email or web address] URL: [email or web address] Date: 04/05/2023 Prepared by: Maylon Peppers  Exercises - Standing Isometric Shoulder Internal Rotation at Doorway  - 2-3 x daily - 3-4 x weekly - 3 sets - 10 reps - Standing Isometric Shoulder External Rotation with Doorway  - 2-3 x daily - 3-4 x weekly - 3 sets - 10 reps - Standing Isometric Shoulder Abduction with Doorway  - 2-3 x daily - 3-4 x weekly - 3 sets - 10 reps - Standing Isometric Shoulder Flexion with Doorway  - 2-3 x daily - 3-4 x weekly - 3 sets - 10 reps - Shoulder Flexion Serratus Activation with Resistance  - 2-3 x daily - 3-4 x weekly - 3 sets - 10 reps   Access Code: [email or web address] URL: [email or web address] Date: 04/12/2023 Prepared by: Maylon Peppers  Exercises - Shoulder External Rotation and Scapular Retraction with Resistance  - 2-3  x daily - 3-4 x weekly - 2 sets - 10 reps - Standing Shoulder Horizontal Abduction with Resistance  - 2-3 x daily - 3-4 x weekly - 2 sets - 10 reps - Horizontal Wall Walk with Resistance  - 2-3 x daily - 3-4 x weekly - 2 sets - 10 reps - Standing Row with Anchored Resistance  - 2-3 x daily - 3-4 x weekly - 2 sets - 10 reps - Shoulder extension with resistance - Neutral  - 2-3 x daily - 3-4 x weekly - 2 sets - 10 reps ASSESSMENT:  CLINICAL IMPRESSION: Pt reports to PT with improved L shoulder pain compared to last session. Session today consisted of HEP review at start of session, to which pt demonstrated good recall and form of all exercises. Rest of the session consisted of introducing resisted rotator cuff and periscapular strengthening exercises. Pt tolerated all exercises well with minimal pain; pt experienced the most pain with horizontal abduction and diagonals and was cued to keep movements within a pain-free range. New resisted exercises added to pt's HEP with plan to trial these initially over the next week. Pt will benefit from continued PT services upon discharge to safely address deficits listed in patient problem list for decreased caregiver assistance and eventual return to PLOF.   OBJECTIVE IMPAIRMENTS: decreased ROM, decreased strength, impaired UE functional use, and postural dysfunction.   ACTIVITY LIMITATIONS: carrying, lifting, dressing, and reach over head  PARTICIPATION LIMITATIONS: cleaning, laundry, occupation, and yard work  PERSONAL FACTORS: Age, Past/current experiences, and Profession are also affecting patient's functional outcome.  REHAB POTENTIAL: Good  CLINICAL DECISION MAKING: Stable/uncomplicated  EVALUATION COMPLEXITY: Low   GOALS: Goals reviewed with patient? Yes  SHORT TERM GOALS: Target date: 04/26/2023  Patient will be independent in HEP to improve strength/mobility for better functional independence with ADLs. Baseline: 8/20: HEP initiated Goal  status: INITIAL  LONG TERM GOALS: Target date: 05/17/2023  Patient will increase FOTO score to equal to or greater than 73 to demonstrate statistically significant improvement in mobility and quality of life.  Baseline: 8/20: 63 Goal status: INITIAL  2.  Patient will increase L UE strength by 1/2 MMT grade to improve ability to complete daily activities and job related tasks.  Baseline: 8/20: see above  Goal status: INITIAL  3.  Patient will improve L shoulder ROM to be symmetrical to R shoulder ROM to be able to perform overhead activities and daily tasks (I.e shutting car door, clasping bra, etc). Baseline:  8/20: see above Goal status: INITIAL  4.  Patient will report <2/10 pain on NRPS to improve tolerance to ADLs and reduced symptoms with activities.  Baseline: 8/20: 5/10 at worst Goal status: INITIAL  PLAN:  PT FREQUENCY: 1-2x/week  PT DURATION: 6 weeks  PLANNED INTERVENTIONS: Therapeutic exercises, Therapeutic activity, Neuromuscular re-education, Patient/Family education, Self Care, Joint mobilization, DME instructions, Spinal mobilization, Cryotherapy, Moist heat, and Manual therapy  PLAN FOR NEXT SESSION: HEP review, progress scapular and UE strengthening, introduce L shoulder inferior glides to improve abduction ROM if necessary   Rebel Laughridge, SPT  Maylon Peppers, PT, DPT Physical Therapist - [facility] 04/12/23, 6:07 PM

## 2023-04-14 ENCOUNTER — Ambulatory Visit: Payer: BC Managed Care – PPO | Admitting: Physical Therapy

## 2023-04-19 ENCOUNTER — Ambulatory Visit: Payer: BC Managed Care – PPO | Attending: Family Medicine

## 2023-04-19 DIAGNOSIS — M6281 Muscle weakness (generalized): Secondary | ICD-10-CM | POA: Diagnosis present

## 2023-04-19 DIAGNOSIS — M25512 Pain in left shoulder: Secondary | ICD-10-CM | POA: Diagnosis present

## 2023-04-19 NOTE — Therapy (Signed)
OUTPATIENT PHYSICAL THERAPY SHOULDER TREATMENT   Patient Name: Deborah Cole MRN: [id number] DOB:[birth year], 57 y.o., female Today's Date: 04/19/2023  END OF SESSION:  PT End of Session - 04/19/23 1828     Visit Number 3    Number of Visits 13    Date for PT Re-Evaluation 05/17/23    PT Start Time 1745    PT Stop Time 1820    PT Time Calculation (min) 35 min    Activity Tolerance Patient tolerated treatment well    Behavior During Therapy [facility] for tasks assessed/performed               Past Medical History:  Diagnosis Date   Acute stress disorder 01/05/2016   Anxiety    Cancer (HCC)    melanoma   Depression 02/18/2015   Polyp of colon    Vitamin D deficiency    Past Surgical History:  Procedure Laterality Date   CESAREAN SECTION     COLONOSCOPY WITH PROPOFOL N/A 03/05/2020   Procedure: COLONOSCOPY WITH PROPOFOL;  Surgeon: Pasty Spillers, MD;  Location: [facility] ENDOSCOPY;  Service: Endoscopy;  Laterality: N/A;   History of Melanoma excision  2005   Malignant   S/P bunionectomy  1984   TUBAL LIGATION     Patient Active Problem List   Diagnosis Date Noted   Nontraumatic rupture of long head of biceps tendon of left shoulder 02/21/2023   Rotator cuff impingement syndrome, left 01/31/2023   Dysfunction of both eustachian tubes 11/09/2022   Acute non-recurrent pansinusitis 11/09/2022   Seasonal allergic rhinitis due to pollen 11/09/2022   Annual physical exam 09/13/2022   Elevated LDL cholesterol level 09/13/2022   Breast cancer screening by mammogram 05/13/2021   Obesity (BMI 30.0-34.9) 05/13/2021   History of melanoma 11/27/2008    PCP: Jacky Kindle, FNP  REFERRING PROVIDER: Jerrol Banana, MD  REFERRING DIAG: [phone] (ICD-10-CM) - Rotator cuff impingement syndrome, left  THERAPY DIAG:  Muscle weakness (generalized)  Acute pain of left shoulder  Rationale for Evaluation and Treatment: Rehabilitation  ONSET DATE: 10/2022  SUBJECTIVE:                                                                                                                                                                                       SUBJECTIVE STATEMENT: Patient reports L shoulder pain and difficulty with certain ideas. Patient reports it began at the end of school year and was having difficulty getting dressed. Reports it was a gradual progression of difficulty and pain with no MOI. Patient reports was having numbness into the posterior aspect of hand but after prednisone this disappeared. Reports clicking  and popping in L shoulder.  Difficulty reaching overhead, shutting car door, carrying bags on L shoulder, sleeping on L side, and hooking bra Hand dominance: Right  PERTINENT HISTORY: Per MD note on 01/31/23, patient complaints of left shoulder pain. This is evaluated as a personal injury. The pain is described as stabbing and radiating. The onset of the pain was  2 months ago. The pain occurs continuously and worse when position herself certain ways  and lasts all day.  Symptoms are aggravated by all activities. Symptoms are diminished by  medication: Tylenol that gives minor relief until she moves again.  Limited activities include: dressing self. No stiffness, no weakness, no swelling is reported. 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  Per MD note on 03/22/23, examination shows interim improvement though persistent focality to the left supraspinatus, now equivocal impingement features, otherwise negative provocative testing.   PAIN:  Are you having pain? Yes: NPRS scale: 5/10 at worst  Pain location: L shoulder  Pain description: aching  Aggravating factors: sleeping on L side, lifting, dressing, shutting the door  Relieving factors: medication, avoiding aggravating motions   PRECAUTIONS: None  RED FLAGS: None   WEIGHT BEARING RESTRICTIONS: No  FALLS:  Has patient fallen in last 6 months? 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   PLOF: Independent  PATIENT GOALS:to be without pain and dress self without pain   NEXT MD VISIT: not scheduled but MD requests to follow up after a couple of weeks of PT   OBJECTIVE:   DIAGNOSTIC FINDINGS:  N/A  PATIENT SURVEYS:  FOTO 63 with goal of 60  COGNITION: Overall cognitive status: Within functional limits for tasks assessed     SENSATION: WFL  POSTURE: Forward head, rounded shoulders   UPPER EXTREMITY ROM:   Cervical spine ruled out with Spurling's test - no provocation of pain  Lumbar spine ruled out with lumbar quadrant test    Active ROM Right eval Left eval  Shoulder flexion [facility]  Shoulder extension [facility]   Shoulder abduction 180 105*  Shoulder adduction    Shoulder internal rotation 70 63  Shoulder external rotation 79 51*  (Blank rows = not tested)  UPPER EXTREMITY MMT:  MMT Right eval Left eval  Shoulder flexion 5 4+  Shoulder extension 4+ 4-  Shoulder abduction 5 3*  Shoulder adduction    Shoulder internal rotation 5 5*  Shoulder external rotation 5 4  Middle trapezius 5 4*  Lower trapezius 3 3*  Latissimus dorsi 4 4  Elbow flexion 5 5  Elbow extension 5 5  (Blank rows = not tested)  SHOULDER SPECIAL TESTS: Impingement tests (on L): Neer impingement test: positive , Hawkins/Kennedy impingement test: positive , and Painful arc test: positive  Rotator cuff assessment: Empty can test: negative and Full can test: negative Biceps assessment: Speed's test: positive   JOINT MOBILITY TESTING:  N/A   PALPATION:  TTP at proximal biceps insertion    TODAY'S TREATMENT:  DATE: 04/19/23  Subjective:  Pt reports no pain in L shoulder today. She reports HEP has been going well over last week; she reports some soreness but no excess pain.     Therex: HEP review - pt demonstrated proper form and good recall of resisted exercises  Resisted Shoulder ER: green TB, 1x10 - mild L shoulder pain (2-3/10)  Standing Shoulder Horizontal Abduction: green TB, 1x10 - pt cued to keep within pain-free range  Standing Shoulder Diagonals: green TB, 1x10, pt cued to keep within pain-free range  Resisted Wall Walks with Green TB around wrists: 2 laps each direction  Resisted Shoulder Rows/Scapular Retractions: green TB, 1x10 - no pain  Resisted Shoulder Extensions: green TB, 1x10 - no pain  Nautilus Lat pulldowns: 50#, x20 - pt cued to align hands on wand to keep shoulder flexion ROM pain-free  Standing Bicep Curls: x15 reps on L with 5# weight, no pain  Standing Pec Stretch at [employer]: 2x2 minute holds, VC's for finding position that is pain-free in L shoulder  Supine Chest Press with bar and 3 + 4# ankle weights: x3 minutes   PATIENT EDUCATION: Education details: HEP, POC, goals  Person educated: Patient Education method: Explanation, Demonstration, and Handouts Education comprehension: verbalized understanding and returned demonstration  HOME EXERCISE PROGRAM: Access Code: [email or web address] URL: [email or web address] Date: 04/05/2023 Prepared by: Maylon Peppers  Exercises - Standing Isometric Shoulder Internal Rotation at Doorway  - 2-3 x daily - 3-4 x weekly - 3 sets - 10 reps - Standing Isometric Shoulder External Rotation with Doorway  - 2-3 x daily - 3-4 x weekly - 3 sets - 10 reps - Standing Isometric Shoulder Abduction with Doorway  - 2-3 x daily - 3-4 x weekly - 3 sets - 10 reps - Standing Isometric Shoulder Flexion with Doorway  - 2-3 x daily - 3-4 x weekly - 3 sets - 10 reps - Shoulder Flexion Serratus Activation with Resistance  - 2-3 x daily -  3-4 x weekly - 3 sets - 10 reps   Access Code: [email or web address] URL: [email or web address] Date: 04/12/2023 Prepared by: Maylon Peppers  Exercises - Shoulder External Rotation and Scapular Retraction with Resistance  - 2-3 x daily - 3-4 x weekly - 2 sets - 10 reps - Standing Shoulder Horizontal Abduction with Resistance  - 2-3 x daily - 3-4 x weekly - 2 sets - 10 reps - Horizontal Wall Walk with Resistance  - 2-3 x daily - 3-4 x weekly - 2 sets - 10 reps - Standing Row with Anchored Resistance  - 2-3 x daily - 3-4 x weekly - 2 sets - 10 reps - Shoulder extension with resistance - Neutral  - 2-3 x daily - 3-4 x weekly - 2 sets - 10 reps ASSESSMENT:  CLINICAL IMPRESSION: Pt reports to PT with improved L shoulder pain compared to last session. Session today consisted of HEP review at start of session; pt demonstrates good form and recall with all resistance band exercises. Pt able to perform all exercises with green band today with only mild L shoulder pain throughout. Pt educated on importance of good posture and performing pec stretch throughout day to improve L shoulder pain and impingement symptoms. Pt's GH rhythm has decreased. Pt will benefit from LT and scapular strengthening exs. Pectoralis stretching and stretching exs.  HEP updated to include all the same exercises with green band, as well as standing pec stretch. Pt will benefit from continued PT services upon discharge to safely address deficits listed  in patient problem list for decreased caregiver assistance and eventual return to PLOF.   OBJECTIVE IMPAIRMENTS: decreased ROM, decreased strength, impaired UE functional use, and postural dysfunction.   ACTIVITY LIMITATIONS: carrying, lifting, dressing, and reach over head  PARTICIPATION LIMITATIONS: cleaning, laundry, occupation, and yard work  PERSONAL FACTORS: Age, Past/current experiences, and Profession are also affecting patient's functional outcome.   REHAB POTENTIAL:  Good  CLINICAL DECISION MAKING: Stable/uncomplicated  EVALUATION COMPLEXITY: Low   GOALS: Goals reviewed with patient? Yes  SHORT TERM GOALS: Target date: 04/26/2023  Patient will be independent in HEP to improve strength/mobility for better functional independence with ADLs. Baseline: 8/20: HEP initiated Goal status: INITIAL  LONG TERM GOALS: Target date: 05/17/2023  Patient will increase FOTO score to equal to or greater than 73 to demonstrate statistically significant improvement in mobility and quality of life.  Baseline: 8/20: 63 Goal status: INITIAL  2.  Patient will increase L UE strength by 1/2 MMT grade to improve ability to complete daily activities and job related tasks.  Baseline: 8/20: see above  Goal status: INITIAL  3.  Patient will improve L shoulder ROM to be symmetrical to R shoulder ROM to be able to perform overhead activities and daily tasks (I.e shutting car door, clasping bra, etc). Baseline:  8/20: see above Goal status: INITIAL  4.  Patient will report <2/10 pain on NRPS to improve tolerance to ADLs and reduced symptoms with activities.  Baseline: 8/20: 5/10 at worst Goal status: INITIAL  PLAN: Plan to introduce pt to LT, Pects, PNF activation.   PT FREQUENCY: 1-2x/week  PT DURATION: 6 weeks  PLANNED INTERVENTIONS: Therapeutic exercises, Therapeutic activity, Neuromuscular re-education, Patient/Family education, Self Care, Joint mobilization, DME instructions, Spinal mobilization, Cryotherapy, Moist heat, and Manual therapy  PLAN FOR NEXT SESSION: HEP review, progress scapular and UE strengthening, introduce L shoulder inferior glides to improve abduction ROM if necessary, follow up on pec stretches   Kadan Millstein, SPT 04/19/23, 6:29 PM

## 2023-04-26 ENCOUNTER — Encounter: Payer: Self-pay | Admitting: Physical Therapy

## 2023-04-26 ENCOUNTER — Ambulatory Visit: Payer: BC Managed Care – PPO

## 2023-04-26 DIAGNOSIS — M6281 Muscle weakness (generalized): Secondary | ICD-10-CM | POA: Diagnosis not present

## 2023-04-26 DIAGNOSIS — M25512 Pain in left shoulder: Secondary | ICD-10-CM

## 2023-04-26 NOTE — Therapy (Signed)
OUTPATIENT PHYSICAL THERAPY SHOULDER TREATMENT   Patient Name: Deborah Cole MRN: [id number] DOB:01/17/1966, 57 y.o., female Today's Date: 04/26/2023  END OF SESSION:  PT End of Session - 04/26/23 1640     Visit Number 4    Number of Visits 13    Date for PT Re-Evaluation 05/17/23    PT Start Time 1642    PT Stop Time 1732    PT Time Calculation (min) 50 min    Activity Tolerance Patient tolerated treatment well    Behavior During Therapy [facility] for tasks assessed/performed                Past Medical History:  Diagnosis Date   Acute stress disorder 01/05/2016   Anxiety    Cancer (HCC)    melanoma   Depression 02/18/2015   Polyp of colon    Vitamin D deficiency    Past Surgical History:  Procedure Laterality Date   CESAREAN SECTION     COLONOSCOPY WITH PROPOFOL N/A 03/05/2020   Procedure: COLONOSCOPY WITH PROPOFOL;  Surgeon: Pasty Spillers, MD;  Location: [facility] ENDOSCOPY;  Service: Endoscopy;  Laterality: N/A;   History of Melanoma excision  2005   Malignant   S/P bunionectomy  1984   TUBAL LIGATION     Patient Active Problem List   Diagnosis Date Noted   Nontraumatic rupture of long head of biceps tendon of left shoulder 02/21/2023   Rotator cuff impingement syndrome, left 01/31/2023   Dysfunction of both eustachian tubes 11/09/2022   Acute non-recurrent pansinusitis 11/09/2022   Seasonal allergic rhinitis due to pollen 11/09/2022   Annual physical exam 09/13/2022   Elevated LDL cholesterol level 09/13/2022   Breast cancer screening by mammogram 05/13/2021   Obesity (BMI 30.0-34.9) 05/13/2021   History of melanoma 11/27/2008    PCP: Jacky Kindle, FNP  REFERRING PROVIDER: Jerrol Banana, MD  REFERRING DIAG: [phone] (ICD-10-CM) - Rotator cuff impingement syndrome, left  THERAPY DIAG:  Muscle weakness (generalized)  Acute pain of left shoulder  Rationale for Evaluation and Treatment: Rehabilitation  ONSET DATE: 10/2022  SUBJECTIVE:                                                                                                                                                                                       SUBJECTIVE STATEMENT: Patient reports L shoulder pain and difficulty with certain ideas. Patient reports it began at the end of school year and was having difficulty getting dressed. Reports it was a gradual progression of difficulty and pain with no MOI. Patient reports was having numbness into the posterior aspect of hand but after prednisone this disappeared. Reports  clicking and popping in L shoulder.  Difficulty reaching overhead, shutting car door, carrying bags on L shoulder, sleeping on L side, and hooking bra Hand dominance: Right  PERTINENT HISTORY: Per MD note on 01/31/23, patient complaints of left shoulder pain. This is evaluated as a personal injury. The pain is described as stabbing and radiating. The onset of the pain was  2 months ago. The pain occurs continuously and worse when position herself certain ways  and lasts all day.  Symptoms are aggravated by all activities. Symptoms are diminished by  medication: Tylenol that gives minor relief until she moves again.  Limited activities include: dressing self. No stiffness, no weakness, no swelling is reported. 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  Per MD note on 03/22/23, examination shows interim improvement though persistent focality to the left supraspinatus, now equivocal impingement features, otherwise negative provocative testing.   PAIN:  Are you having pain? Yes: NPRS scale: 5/10 at worst  Pain location: L shoulder  Pain description: aching  Aggravating factors: sleeping on L side, lifting, dressing, shutting the door  Relieving factors: medication, avoiding aggravating motions   PRECAUTIONS: None  RED FLAGS: None   WEIGHT BEARING RESTRICTIONS: No  FALLS:  Has patient fallen in last 6 months? 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   PLOF: Independent  PATIENT GOALS:to be without pain and dress self without pain   NEXT MD VISIT: not scheduled but MD requests to follow up after a couple of weeks of PT   OBJECTIVE:   DIAGNOSTIC FINDINGS:  N/A  PATIENT SURVEYS:  FOTO 63 with goal of 66  COGNITION: Overall cognitive status: Within functional limits for tasks assessed     SENSATION: WFL  POSTURE: Forward head, rounded shoulders   UPPER EXTREMITY ROM:   Cervical spine ruled out with Spurling's test - no provocation of pain  Lumbar spine ruled out with lumbar quadrant test    Active ROM Right eval Left eval  Shoulder flexion [facility]  Shoulder extension [facility]   Shoulder abduction 180 105*  Shoulder adduction    Shoulder internal rotation 70 63  Shoulder external rotation 79 51*  (Blank rows = not tested)  UPPER EXTREMITY MMT:  MMT Right eval Left eval  Shoulder flexion 5 4+  Shoulder extension 4+ 4-  Shoulder abduction 5 3*  Shoulder adduction    Shoulder internal rotation 5 5*  Shoulder external rotation 5 4  Middle trapezius 5 4*  Lower trapezius 3 3*  Latissimus dorsi 4 4  Elbow flexion 5 5  Elbow extension 5 5  (Blank rows = not tested)  SHOULDER SPECIAL TESTS: Impingement tests (on L): Neer impingement test: positive , Hawkins/Kennedy impingement test: positive , and Painful arc test: positive  Rotator cuff assessment: Empty can test: negative and Full can test: negative Biceps assessment: Speed's test: positive   JOINT MOBILITY TESTING:  N/A   PALPATION:  TTP at proximal biceps insertion    TODAY'S TREATMENT:  DATE: 04/26/23  Subjective:  Pt reports 1/10 L shoulder pain now, she says she irritated it moving things around yesterday. She says her exercises aren't painful but she is not sure if the form is going well. She reports feeling a little sore after last session, lasted around 24 hours.   Therex: UBE warm-up x 4 mins (2 fwd/back)  Seated Resisted Shoulder Rows/Scapular Retractions: red TB, 1x1 minute  Standing Isometric Bicep Curls: 2x1 minute holds on L with 2# weight, no pain  Standing Shoulder Flexion/Abduction/scaption with 2# weights: each x1 minute, VC's for keeping within pain-free range (max of 90 degrees)  Standing Pec Stretch at [employer]: x2 minute holds, VC's for finding position that is pain-free in L shoulder  Standing W's against wall x 1 minute  Manual Therapy: Ultrasound to L biceps head tendon: continuous, Frequency: 3 Mhz, Duty Cycle 100%, 1.0 W/cm^2, x5 minutes   Cross Friction Massage to L biceps head tendon x 3 minutes   Humeral Distraction in standing x 1 minute  Not today: Supine Chest Press with bar and 3 + 4# ankle weights: x3 minutes Resisted Shoulder ER: green TB, 1x10 - mild L shoulder pain (2-3/10) Standing Shoulder Horizontal Abduction: green TB, 1x10 - pt cued to keep within pain-free range Standing Shoulder Diagonals: green TB, 1x10, pt cued to keep within pain-free range Resisted Wall Walks with Green TB around wrists: 2 laps each direction Resisted Shoulder Extensions: green TB, 1x10 - no pain Nautilus Lat pulldowns: 50#, x20 - pt cued to align hands on wand to keep shoulder flexion ROM pain-free  PATIENT EDUCATION: Education details: HEP, POC, goals  Person educated: Patient Education method: Explanation, Demonstration, and Handouts Education comprehension: verbalized understanding and returned demonstration  HOME EXERCISE PROGRAM: Access Code: [email or web address] URL: [email or web address] Date: 04/05/2023 Prepared by: Maylon Peppers  Exercises - Standing Isometric Shoulder Internal Rotation at Doorway  - 2-3 x daily - 3-4 x weekly - 3 sets - 10 reps - Standing Isometric Shoulder External Rotation with Doorway  - 2-3 x daily - 3-4 x weekly - 3 sets - 10 reps - Standing Isometric Shoulder Abduction with Doorway  - 2-3 x daily - 3-4 x weekly - 3 sets - 10 reps - Standing Isometric Shoulder Flexion with Doorway  - 2-3 x daily - 3-4 x weekly - 3 sets - 10 reps - Shoulder Flexion Serratus Activation with Resistance  - 2-3 x daily - 3-4 x weekly - 3 sets - 10 reps   Access Code: [email or web address] URL: [email or web address] Date: 04/12/2023 Prepared by: Maylon Peppers  Exercises - Shoulder External Rotation and Scapular Retraction with Resistance  - 2-3 x daily - 3-4 x weekly - 2 sets - 10 reps - Standing Shoulder Horizontal Abduction with Resistance  - 2-3 x daily - 3-4 x weekly - 2 sets - 10 reps - Horizontal Wall Walk with Resistance  - 2-3 x daily - 3-4 x weekly - 2 sets - 10 reps - Standing Row with Anchored Resistance  - 2-3 x daily - 3-4 x weekly - 2 sets - 10 reps - Shoulder extension with resistance - Neutral  - 2-3 x daily - 3-4 x weekly - 2 sets - 10 reps ASSESSMENT:  CLINICAL IMPRESSION: Pt reports to PT with improved mild L shoulder pain. Session consisted of ultrasound and manual techniques initially to try and improve biceps tendonitis symptoms in L shoulder. Rest of the session consisted of low weight, high repetition strengthening exercises for shoulder flexion/abduction/scaption within a  pain-free range. Pt demonstrated improved L shoulder abduction and flexion AROM at end of session and reported improvement in pain. Pt will benefit from slow progress with Wts to 90 deg using 2,3.4 and 5 lbs first and then to full ROM with slow progression.  Pt will benefit from continued PT services upon discharge to safely address deficits listed in patient problem list for decreased caregiver assistance and  eventual return to PLOF.   OBJECTIVE IMPAIRMENTS: decreased ROM, decreased strength, impaired UE functional use, and postural dysfunction.   ACTIVITY LIMITATIONS: carrying, lifting, dressing, and reach over head  PARTICIPATION LIMITATIONS: cleaning, laundry, occupation, and yard work  PERSONAL FACTORS: Age, Past/current experiences, and Profession are also affecting patient's functional outcome.   REHAB POTENTIAL: Good  CLINICAL DECISION MAKING: Stable/uncomplicated  EVALUATION COMPLEXITY: Low   GOALS: Goals reviewed with patient? Yes  SHORT TERM GOALS: Target date: 04/26/2023  Patient will be independent in HEP to improve strength/mobility for better functional independence with ADLs. Baseline: 8/20: HEP initiated Goal status: INITIAL  LONG TERM GOALS: Target date: 05/17/2023  Patient will increase FOTO score to equal to or greater than 73 to demonstrate statistically significant improvement in mobility and quality of life.  Baseline: 8/20: 63 Goal status: INITIAL  2.  Patient will increase L UE strength by 1/2 MMT grade to improve ability to complete daily activities and job related tasks.  Baseline: 8/20: see above  Goal status: INITIAL  3.  Patient will improve L shoulder ROM to be symmetrical to R shoulder ROM to be able to perform overhead activities and daily tasks (I.e shutting car door, clasping bra, etc). Baseline:  8/20: see above Goal status: INITIAL  4.  Patient will report <2/10 pain on NRPS to improve tolerance to ADLs and reduced symptoms with activities.  Baseline: 8/20: 5/10 at worst Goal status: INITIAL  PLAN: Plan to introduce pt to LT, Pects, PNF activation.   PT FREQUENCY: 1-2x/week  PT DURATION: 6 weeks  PLANNED INTERVENTIONS: Therapeutic exercises, Therapeutic activity, Neuromuscular re-education, Patient/Family education, Self Care, Joint mobilization, DME instructions, Spinal mobilization, Cryotherapy, Moist heat, and Manual therapy  PLAN  FOR NEXT SESSION: Progress scapular and UE strengthening, shoulder flexion/abd/scaption with light weights in pain free range, cross friction massage to L biceps tendon, ultrasound to L biceps tendon, biceps strengthening light weights   Alyene Predmore, SPT 04/26/23, 5:32 PM

## 2023-04-28 ENCOUNTER — Encounter: Payer: BC Managed Care – PPO | Admitting: Physical Therapy

## 2023-05-03 ENCOUNTER — Ambulatory Visit: Payer: BC Managed Care – PPO

## 2023-05-03 DIAGNOSIS — M6281 Muscle weakness (generalized): Secondary | ICD-10-CM | POA: Diagnosis not present

## 2023-05-03 DIAGNOSIS — M25512 Pain in left shoulder: Secondary | ICD-10-CM

## 2023-05-03 NOTE — Therapy (Addendum)
OUTPATIENT PHYSICAL THERAPY SHOULDER TREATMENT   Patient Name: Deborah Cole MRN: [id number] DOB:12-05-1965, 57 y.o., female Today's Date: 05/03/2023  END OF SESSION:  PT End of Session - 05/03/23 1733     Visit Number 5    Number of Visits 13    Date for PT Re-Evaluation 05/17/23    PT Start Time 1730    PT Stop Time 1818    PT Time Calculation (min) 48 min    Activity Tolerance Patient tolerated treatment well    Behavior During Therapy [facility] for tasks assessed/performed                 Past Medical History:  Diagnosis Date   Acute stress disorder 01/05/2016   Anxiety    Cancer (HCC)    melanoma   Depression 02/18/2015   Polyp of colon    Vitamin D deficiency    Past Surgical History:  Procedure Laterality Date   CESAREAN SECTION     COLONOSCOPY WITH PROPOFOL N/A 03/05/2020   Procedure: COLONOSCOPY WITH PROPOFOL;  Surgeon: Pasty Spillers, MD;  Location: [facility] ENDOSCOPY;  Service: Endoscopy;  Laterality: N/A;   History of Melanoma excision  2005   Malignant   S/P bunionectomy  1984   TUBAL LIGATION     Patient Active Problem List   Diagnosis Date Noted   Nontraumatic rupture of long head of biceps tendon of left shoulder 02/21/2023   Rotator cuff impingement syndrome, left 01/31/2023   Dysfunction of both eustachian tubes 11/09/2022   Acute non-recurrent pansinusitis 11/09/2022   Seasonal allergic rhinitis due to pollen 11/09/2022   Annual physical exam 09/13/2022   Elevated LDL cholesterol level 09/13/2022   Breast cancer screening by mammogram 05/13/2021   Obesity (BMI 30.0-34.9) 05/13/2021   History of melanoma 11/27/2008    PCP: Jacky Kindle, FNP  REFERRING PROVIDER: Jerrol Banana, MD  REFERRING DIAG: [phone] (ICD-10-CM) - Rotator cuff impingement syndrome, left  THERAPY DIAG:  Muscle weakness (generalized)  Acute pain of left shoulder  Rationale for Evaluation and Treatment: Rehabilitation  ONSET DATE: 10/2022  SUBJECTIVE:                                                                                                                                                                                       SUBJECTIVE STATEMENT: Patient reports L shoulder pain and difficulty with certain ideas. Patient reports it began at the end of school year and was having difficulty getting dressed. Reports it was a gradual progression of difficulty and pain with no MOI. Patient reports was having numbness into the posterior aspect of hand but after prednisone this disappeared.  Reports clicking and popping in L shoulder.  Difficulty reaching overhead, shutting car door, carrying bags on L shoulder, sleeping on L side, and hooking bra Hand dominance: Right  PERTINENT HISTORY: Per MD note on 01/31/23, patient complaints of left shoulder pain. This is evaluated as a personal injury. The pain is described as stabbing and radiating. The onset of the pain was  2 months ago. The pain occurs continuously and worse when position herself certain ways  and lasts all day.  Symptoms are aggravated by all activities. Symptoms are diminished by  medication: Tylenol that gives minor relief until she moves again.  Limited activities include: dressing self. No stiffness, no weakness, no swelling is reported. 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  Per MD note on 03/22/23, examination shows interim improvement though persistent focality to the left supraspinatus, now equivocal impingement features, otherwise negative provocative testing.   PAIN:  Are you having pain? Yes: NPRS scale: 5/10 at worst  Pain location: L shoulder  Pain description: aching  Aggravating factors: sleeping on L side, lifting, dressing, shutting the door  Relieving factors: medication, avoiding aggravating motions   PRECAUTIONS: None  RED FLAGS: None   WEIGHT BEARING RESTRICTIONS: No  FALLS:  Has patient fallen in last 6 months? 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   PLOF: Independent  PATIENT GOALS:to be without pain and dress self without pain   NEXT MD VISIT: not scheduled but MD requests to follow up after a couple of weeks of PT   OBJECTIVE:   DIAGNOSTIC FINDINGS:  N/A  PATIENT SURVEYS:  FOTO 63 with goal of 61  COGNITION: Overall cognitive status: Within functional limits for tasks assessed     SENSATION: WFL  POSTURE: Forward head, rounded shoulders   UPPER EXTREMITY ROM:   Cervical spine ruled out with Spurling's test - no provocation of pain  Lumbar spine ruled out with lumbar quadrant test    Active ROM Right eval Left eval  Shoulder flexion [facility]  Shoulder extension [facility]   Shoulder abduction 180 105*  Shoulder adduction    Shoulder internal rotation 70 63  Shoulder external rotation 79 51*  (Blank rows = not tested)  UPPER EXTREMITY MMT:  MMT Right eval Left eval  Shoulder flexion 5 4+  Shoulder extension 4+ 4-  Shoulder abduction 5 3*  Shoulder adduction    Shoulder internal rotation 5 5*  Shoulder external rotation 5 4  Middle trapezius 5 4*  Lower trapezius 3 3*  Latissimus dorsi 4 4  Elbow flexion 5 5  Elbow extension 5 5  (Blank rows = not tested)  SHOULDER SPECIAL TESTS: Impingement tests (on L): Neer impingement test: positive , Hawkins/Kennedy impingement test: positive , and Painful arc test: positive  Rotator cuff assessment: Empty can test: negative and Full can test: negative Biceps assessment: Speed's test: positive   JOINT MOBILITY TESTING:  N/A   PALPATION:  TTP at proximal biceps insertion    TODAY'S TREATMENT:  DATE: 05/03/23  Subjective:  Pt reports 0/10 pain now. She says her exercises and stretches have been going well. She reports improved symptoms with functional tasks such as putting on her seatbelt.  Therex: UBE warm-up x 6 mins (3 fwd/back)  Seated Resisted Shoulder Rows/Scapular Retractions: green TB, 2x1 minute  Standing Shoulder Flexion with 2# weights: 2 x1 minute, VC's for keeping within pain-free range (pt able to attain ~160 degrees with no pain)  Standing Pec Stretch at [employer]: 2x1 minute holds, VC's for finding position that is pain-free in L shoulder  Bicep Curl 21's: 7 reps at bottom half ROM, 7 reps top half ROM, 7 reps full ROM, 2# weight, 2 sets  Seated resisted supination: 2 sets of 1 minute bouts on L arm, green TB  Manual Therapy: Ultrasound to L biceps head tendon: continuous, Frequency: 3 Mhz, Duty Cycle 100%, 1.0 W/cm^2, x5 minutes   Cross Friction Massage to L biceps head tendon x 3 minutes   Standing L bicep stretch: 2x30 second holds, no pain   Not today: Supine Chest Press with bar and 3 + 4# ankle weights: x3 minutes Resisted Shoulder ER: green TB, 1x10 - mild L shoulder pain (2-3/10) Standing Shoulder Horizontal Abduction: green TB, 1x10 - pt cued to keep within pain-free range Standing Shoulder Diagonals: green TB, 1x10, pt cued to keep within pain-free range Resisted Wall Walks with Green TB around wrists: 2 laps each direction Resisted Shoulder Extensions: green TB, 1x10 - no pain Nautilus Lat pulldowns: 50#, x20 - pt cued to align hands on wand to keep shoulder flexion ROM pain-free  PATIENT EDUCATION: Education details: HEP, POC, goals  Person educated: Patient Education method: Explanation, Demonstration, and Handouts Education comprehension: verbalized understanding and returned demonstration  HOME EXERCISE PROGRAM: Access Code: [email or web address] URL: [email or web address] Date: 05/03/2023 Prepared by: Janet Berlin Exercises -  Forearm Supination with Resistance  - 1 x daily - 7 x weekly - 3 sets - 10 reps - Standing Single Arm Elbow Flexion with Resistance  - 1 x daily - 7 x weekly - 3 sets - 10 reps - Scapular Retraction with Resistance  - 1 x daily - 7 x weekly - 3 sets - 10 reps ASSESSMENT:  CLINICAL IMPRESSION: Pt reports to PT with improved L shoulder pain and symptoms since last week. She reports improvement with functional tasks such as putting on her seatbelt. Session today consisted of ultrasound to L biceps tendon insertion point as well as cross friction massage and stretching to decrease pain and improve ROM. Pt able to perform resisted shoulder flexion exercise with improved pain-free ROM. Pt tolerated new resisted supination and bicep curl exercises well with no pain. HEP modified to include bicep curls and resisted supination exercises. Pt will benefit from continued PT services upon discharge to safely address deficits listed in patient problem list for decreased caregiver assistance and eventual return to PLOF.   OBJECTIVE IMPAIRMENTS: decreased ROM, decreased strength, impaired UE functional use, and postural dysfunction.   ACTIVITY LIMITATIONS: carrying, lifting, dressing, and reach over head  PARTICIPATION LIMITATIONS: cleaning, laundry, occupation, and yard work  PERSONAL FACTORS: Age, Past/current experiences, and Profession are also affecting patient's functional outcome.   REHAB POTENTIAL: Good  CLINICAL DECISION MAKING: Stable/uncomplicated  EVALUATION COMPLEXITY: Low   GOALS: Goals reviewed with patient? Yes  SHORT TERM GOALS: Target date: 04/26/2023  Patient will be independent in HEP to improve strength/mobility for better functional independence with ADLs. Baseline: 8/20: HEP initiated Goal status: INITIAL  LONG  TERM GOALS: Target date: 05/17/2023  Patient will increase FOTO score to equal to or greater than 73 to demonstrate statistically significant improvement in mobility  and quality of life.  Baseline: 8/20: 63 Goal status: INITIAL  2.  Patient will increase L UE strength by 1/2 MMT grade to improve ability to complete daily activities and job related tasks.  Baseline: 8/20: see above  Goal status: INITIAL  3.  Patient will improve L shoulder ROM to be symmetrical to R shoulder ROM to be able to perform overhead activities and daily tasks (I.e shutting car door, clasping bra, etc). Baseline:  8/20: see above Goal status: INITIAL  4.  Patient will report <2/10 pain on NRPS to improve tolerance to ADLs and reduced symptoms with activities.  Baseline: 8/20: 5/10 at worst Goal status: INITIAL  PLAN: Plan to introduce pt to LT, Pects, PNF activation.   PT FREQUENCY: 1-2x/week  PT DURATION: 6 weeks  PLANNED INTERVENTIONS: Therapeutic exercises, Therapeutic activity, Neuromuscular re-education, Patient/Family education, Self Care, Joint mobilization, DME instructions, Spinal mobilization, Cryotherapy, Moist heat, and Manual therapy  PLAN FOR NEXT SESSION: Progress scapular and UE strengthening, shoulder flexion/abd/scaption with light weights in pain free range, cross friction massage to L biceps tendon, ultrasound to L biceps tendon, biceps strengthening light weights   Cary Lothrop, SPT 05/03/23, 6:29 PM

## 2023-05-05 ENCOUNTER — Encounter: Payer: BC Managed Care – PPO | Admitting: Physical Therapy

## 2023-05-10 ENCOUNTER — Encounter: Payer: Self-pay | Admitting: Physical Therapy

## 2023-05-10 ENCOUNTER — Ambulatory Visit: Payer: BC Managed Care – PPO | Admitting: Physical Therapy

## 2023-05-10 DIAGNOSIS — M25512 Pain in left shoulder: Secondary | ICD-10-CM

## 2023-05-10 DIAGNOSIS — M6281 Muscle weakness (generalized): Secondary | ICD-10-CM

## 2023-05-10 NOTE — Therapy (Unsigned)
OUTPATIENT PHYSICAL THERAPY SHOULDER TREATMENT   Patient Name: Deborah Cole MRN: [id number] DOB:[birth year], 57 y.o., female Today's Date: 05/10/2023  END OF SESSION:  PT End of Session - 05/10/23 1607     Visit Number 6    Number of Visits 13    Date for PT Re-Evaluation 05/17/23    PT Start Time 1604    PT Stop Time 1647    PT Time Calculation (min) 43 min    Activity Tolerance Patient tolerated treatment well    Behavior During Therapy [facility] for tasks assessed/performed            Past Medical History:  Diagnosis Date   Acute stress disorder 01/05/2016   Anxiety    Cancer (HCC)    melanoma   Depression 02/18/2015   Polyp of colon    Vitamin D deficiency    Past Surgical History:  Procedure Laterality Date   CESAREAN SECTION     COLONOSCOPY WITH PROPOFOL N/A 03/05/2020   Procedure: COLONOSCOPY WITH PROPOFOL;  Surgeon: Pasty Spillers, MD;  Location: [facility] ENDOSCOPY;  Service: Endoscopy;  Laterality: N/A;   History of Melanoma excision  2005   Malignant   S/P bunionectomy  1984   TUBAL LIGATION     Patient Active Problem List   Diagnosis Date Noted   Nontraumatic rupture of long head of biceps tendon of left shoulder 02/21/2023   Rotator cuff impingement syndrome, left 01/31/2023   Dysfunction of both eustachian tubes 11/09/2022   Acute non-recurrent pansinusitis 11/09/2022   Seasonal allergic rhinitis due to pollen 11/09/2022   Annual physical exam 09/13/2022   Elevated LDL cholesterol level 09/13/2022   Breast cancer screening by mammogram 05/13/2021   Obesity (BMI 30.0-34.9) 05/13/2021   History of melanoma 11/27/2008    PCP: Jacky Kindle, FNP  REFERRING PROVIDER: Jerrol Banana, MD  REFERRING DIAG: [phone] (ICD-10-CM) - Rotator cuff impingement syndrome, left  THERAPY DIAG:  Muscle weakness (generalized)  Acute pain of left shoulder  Rationale for Evaluation and Treatment: Rehabilitation  ONSET DATE: 10/2022  SUBJECTIVE:                                                                                                                                                                                       SUBJECTIVE STATEMENT: Patient reports L shoulder pain and difficulty with certain ideas. Patient reports it began at the end of school year and was having difficulty getting dressed. Reports it was a gradual progression of difficulty and pain with no MOI. Patient reports was having numbness into the posterior aspect of hand but after prednisone this disappeared. Reports clicking and popping in  L shoulder.  Difficulty reaching overhead, shutting car door, carrying bags on L shoulder, sleeping on L side, and hooking bra Hand dominance: Right  PERTINENT HISTORY: Per MD note on 01/31/23, patient complaints of left shoulder pain. This is evaluated as a personal injury. The pain is described as stabbing and radiating. The onset of the pain was  2 months ago. The pain occurs continuously and worse when position herself certain ways  and lasts all day.  Symptoms are aggravated by all activities. Symptoms are diminished by  medication: Tylenol that gives minor relief until she moves again.  Limited activities include: dressing self. No stiffness, no weakness, no swelling is reported. 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  Per MD note on 03/22/23, examination shows interim improvement though persistent focality to the left supraspinatus, now equivocal impingement features, otherwise negative provocative testing.   PAIN:  Are you having pain? Yes: NPRS scale: 5/10 at worst  Pain location: L shoulder  Pain description: aching  Aggravating factors: sleeping on L side, lifting, dressing, shutting the door  Relieving factors: medication, avoiding aggravating motions   PRECAUTIONS: None  RED FLAGS: None   WEIGHT BEARING RESTRICTIONS: No  FALLS:  Has patient fallen in last 6 months? 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   PLOF: Independent  PATIENT GOALS:to be without pain and dress self without pain   NEXT MD VISIT: not scheduled but MD requests to follow up after a couple of weeks of PT   OBJECTIVE:   DIAGNOSTIC FINDINGS:  N/A  PATIENT SURVEYS:  FOTO 63 with goal of 77  COGNITION: Overall cognitive status: Within functional limits for tasks assessed     SENSATION: WFL  POSTURE: Forward head, rounded shoulders   UPPER EXTREMITY ROM:   Cervical spine ruled out with Spurling's test - no provocation of pain  Lumbar spine ruled out with lumbar quadrant test    Active ROM Right eval Left eval  Shoulder flexion [name]  Shoulder extension [facility]   Shoulder abduction 180 105*  Shoulder adduction    Shoulder internal rotation 70 63  Shoulder external rotation 79 51*  (Blank rows = not tested)  UPPER EXTREMITY MMT:  MMT Right eval Left eval  Shoulder flexion 5 4+  Shoulder extension 4+ 4-  Shoulder abduction 5 3*  Shoulder adduction    Shoulder internal rotation 5 5*  Shoulder external rotation 5 4  Middle trapezius 5 4*  Lower trapezius 3 3*  Latissimus dorsi 4 4  Elbow flexion 5 5  Elbow extension 5 5  (Blank rows = not tested)  SHOULDER SPECIAL TESTS: Impingement tests (on L): Neer impingement test: positive , Hawkins/Kennedy impingement test: positive , and Painful arc test: positive  Rotator cuff assessment: Empty can test: negative and Full can test: negative Biceps assessment: Speed's test: positive   JOINT MOBILITY TESTING:  N/A   PALPATION:  TTP at proximal biceps insertion    TODAY'S TREATMENT:  DATE: 05/10/23  Subjective:  Pt reports 0/10 pain now. She says her exercises and stretches have been going well; not causing any increase in pain. She went to the beach this past weekend and reports no major changes in pain.   Therex: UBE warm-up x 6 mins (3 fwd/back)  Nautilus machine: Scapular retractions (60#, 2x15), shoulder extensions (40#, 2x15)  Standing Shoulder Flexion with 4# weights: 2 x1 minute, VC's for keeping within pain-free range (pt able to attain ~90 degrees with no pain with increased weight today)  Bicep Curl 21's: 7 reps at bottom half ROM, 7 reps top half ROM, 7 reps full ROM, 4# weight, 2 sets  Seated resisted supination: 2 sets of 1 minute bouts on L arm, blue TB   Manual Therapy: Ultrasound to L biceps head tendon: continuous, Frequency: 3 Mhz, Duty Cycle 100%, 1.0 W/cm^2, x5 minutes   No charge  Armed forces operational officer Massage to L biceps head tendon x 3 minutes     Not today: Standing L bicep stretch: 2x30 second holds, no pain  Standing Pec Stretch at [employer]: 2x1 minute holds, VC's for finding position that is pain-free in L shoulder Supine Chest Press with bar and 3 + 4# ankle weights: x3 minutes Resisted Shoulder ER: green TB, 1x10 - mild L shoulder pain (2-3/10) Standing Shoulder Horizontal Abduction: green TB, 1x10 - pt cued to keep within pain-free range Standing Shoulder Diagonals: green TB, 1x10, pt cued to keep within pain-free range Resisted Wall Walks with Green TB around wrists: 2 laps each direction Resisted Shoulder Extensions: green TB, 1x10 - no pain Nautilus Lat pulldowns: 50#, x20 - pt cued to align hands on wand to keep shoulder flexion ROM pain-free  PATIENT EDUCATION: Education details: HEP, POC, goals  Person educated: Patient Education method: Explanation, Demonstration, and Handouts Education comprehension: verbalized understanding and returned demonstration  HOME EXERCISE PROGRAM: Access Code: [email or web address] URL:  [email or web address] Date: 05/03/2023 Prepared by: Janet Berlin Exercises - Forearm Supination with Resistance  - 1 x daily - 7 x weekly - 3 sets - 10 reps - Standing Single Arm Elbow Flexion with Resistance  - 1 x daily - 7 x weekly - 3 sets - 10 reps - Scapular Retraction with Resistance  - 1 x daily - 7 x weekly - 3 sets - 10 reps ASSESSMENT:  CLINICAL IMPRESSION: Pt reports to PT with improved L shoulder pain and symptoms since last week. She reports continued improvement with functional tasks such as getting dressed and putting on her seatbelt. Ultrasound with cross friction massage performed again at start of session to improve L biceps tendon irritation. HEP reviewed and pt able to progress exercises to higher weight/resistance without increased L shoulder pain. With resisted shoulder flexion exercise pt was able to increase weight to 4# however could only achieve ~90 degrees of shoulder flexion before experiencing some pain.  Pt educated to continue with exercises and stretches while keeping all within a pain free range. Pt will benefit from continued PT services upon discharge to safely address deficits listed in patient problem list for decreased caregiver assistance and eventual return to PLOF.   OBJECTIVE IMPAIRMENTS: decreased ROM, decreased strength, impaired UE functional use, and postural dysfunction.   ACTIVITY LIMITATIONS: carrying, lifting, dressing, and reach over head  PARTICIPATION LIMITATIONS: cleaning, laundry, occupation, and yard work  PERSONAL FACTORS: Age, Past/current experiences, and Profession are also affecting patient's functional outcome.   REHAB POTENTIAL: Good  CLINICAL DECISION MAKING: Stable/uncomplicated  EVALUATION COMPLEXITY: Low  GOALS: Goals reviewed with patient? Yes  SHORT TERM GOALS: Target date: 04/26/2023  Patient will be independent in HEP to improve strength/mobility for better functional independence with  ADLs. Baseline: 8/20: HEP initiated Goal status: Goal met  LONG TERM GOALS: Target date: 05/17/2023  Patient will increase FOTO score to equal to or greater than 73 to demonstrate statistically significant improvement in mobility and quality of life.  Baseline: 8/20: 63 Goal status: INITIAL  2.  Patient will increase L UE strength by 1/2 MMT grade to improve ability to complete daily activities and job related tasks.  Baseline: 8/20: see above  Goal status: INITIAL  3.  Patient will improve L shoulder ROM to be symmetrical to R shoulder ROM to be able to perform overhead activities and daily tasks (I.e shutting car door, clasping bra, etc). Baseline:  8/20: see above Goal status: INITIAL  4.  Patient will report <2/10 pain on NRPS to improve tolerance to ADLs and reduced symptoms with activities.  Baseline: 8/20: 5/10 at worst Goal status: INITIAL    PT FREQUENCY: 1-2x/week  PT DURATION: 6 weeks  PLANNED INTERVENTIONS: Therapeutic exercises, Therapeutic activity, Neuromuscular re-education, Patient/Family education, Self Care, Joint mobilization, DME instructions, Spinal mobilization, Cryotherapy, Moist heat, and Manual therapy  PLAN FOR NEXT SESSION: Progress scapular and UE strengthening, shoulder flexion/abd/scaption with light weights in pain free range, biceps strengthening light weights, PNF.  CHECK LTGs  Cammie Mcgee, PT, DPT # [phone] Cena Benton, SPT 05/10/23, 5:54 PM

## 2023-05-12 ENCOUNTER — Encounter: Payer: BC Managed Care – PPO | Admitting: Physical Therapy

## 2023-05-17 ENCOUNTER — Ambulatory Visit: Payer: BC Managed Care – PPO | Attending: Family Medicine

## 2023-05-17 ENCOUNTER — Encounter: Payer: Self-pay | Admitting: Physical Therapy

## 2023-05-17 DIAGNOSIS — M6281 Muscle weakness (generalized): Secondary | ICD-10-CM | POA: Insufficient documentation

## 2023-05-17 DIAGNOSIS — M25512 Pain in left shoulder: Secondary | ICD-10-CM | POA: Insufficient documentation

## 2023-05-17 NOTE — Therapy (Signed)
OUTPATIENT PHYSICAL THERAPY SHOULDER TREATMENT & DISCHARGE   Patient Name: Deborah Cole MRN: [id number] DOB:Oct 29, 1965, 57 y.o., female Today's Date: 05/17/2023  END OF SESSION:  PT End of Session - 05/17/23 1744     Visit Number 7    Number of Visits 13    Date for PT Re-Evaluation 05/17/23    PT Start Time 1723    PT Stop Time 1744    PT Time Calculation (min) 21 min    Activity Tolerance Patient tolerated treatment well    Behavior During Therapy [facility] for tasks assessed/performed             Past Medical History:  Diagnosis Date   Acute stress disorder 01/05/2016   Anxiety    Cancer (HCC)    melanoma   Depression 02/18/2015   Polyp of colon    Vitamin D deficiency    Past Surgical History:  Procedure Laterality Date   CESAREAN SECTION     COLONOSCOPY WITH PROPOFOL N/A 03/05/2020   Procedure: COLONOSCOPY WITH PROPOFOL;  Surgeon: Pasty Spillers, MD;  Location: [facility] ENDOSCOPY;  Service: Endoscopy;  Laterality: N/A;   History of Melanoma excision  2005   Malignant   S/P bunionectomy  1984   TUBAL LIGATION     Patient Active Problem List   Diagnosis Date Noted   Nontraumatic rupture of long head of biceps tendon of left shoulder 02/21/2023   Rotator cuff impingement syndrome, left 01/31/2023   Dysfunction of both eustachian tubes 11/09/2022   Acute non-recurrent pansinusitis 11/09/2022   Seasonal allergic rhinitis due to pollen 11/09/2022   Annual physical exam 09/13/2022   Elevated LDL cholesterol level 09/13/2022   Breast cancer screening by mammogram 05/13/2021   Obesity (BMI 30.0-34.9) 05/13/2021   History of melanoma 11/27/2008    PCP: Jacky Kindle, FNP  REFERRING PROVIDER: Jerrol Banana, MD  REFERRING DIAG: [id number] (ICD-10-CM) - Rotator cuff impingement syndrome, left  THERAPY DIAG:  Muscle weakness (generalized)  Acute pain of left shoulder  Rationale for Evaluation and Treatment: Rehabilitation  ONSET DATE: 10/2022  SUBJECTIVE:                                                                                                                                                                                       SUBJECTIVE STATEMENT: Patient reports L shoulder pain and difficulty with certain ideas. Patient reports it began at the end of school year and was having difficulty getting dressed. Reports it was a gradual progression of difficulty and pain with no MOI. Patient reports was having numbness into the posterior aspect of hand but after prednisone this disappeared. Reports clicking  and popping in L shoulder.  Difficulty reaching overhead, shutting car door, carrying bags on L shoulder, sleeping on L side, and hooking bra Hand dominance: Right  PERTINENT HISTORY: Per MD note on 01/31/23, patient complaints of left shoulder pain. This is evaluated as a personal injury. The pain is described as stabbing and radiating. The onset of the pain was  2 months ago. The pain occurs continuously and worse when position herself certain ways  and lasts all day.  Symptoms are aggravated by all activities. Symptoms are diminished by  medication: Tylenol that gives minor relief until she moves again.  Limited activities include: dressing self. No stiffness, no weakness, no swelling is reported. 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  Per MD note on 03/22/23, examination shows interim improvement though persistent focality to the left supraspinatus, now equivocal impingement features, otherwise negative provocative testing.   PAIN:  Are you having pain? Yes: NPRS scale: 5/10 at worst  Pain location: L shoulder  Pain description: aching  Aggravating factors: sleeping on L side, lifting, dressing, shutting the door  Relieving factors: medication, avoiding aggravating motions   PRECAUTIONS: None  RED FLAGS: None   WEIGHT BEARING RESTRICTIONS: No  FALLS:  Has patient fallen in last 6 months? 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   PLOF: Independent  PATIENT GOALS:to be without pain and dress self without pain   NEXT MD VISIT: not scheduled but MD requests to follow up after a couple of weeks of PT   OBJECTIVE:   DIAGNOSTIC FINDINGS:  N/A  PATIENT SURVEYS:  FOTO 63 with goal of 49  COGNITION: Overall cognitive status: Within functional limits for tasks assessed     SENSATION: WFL  POSTURE: Forward head, rounded shoulders   UPPER EXTREMITY ROM:   Cervical spine ruled out with Spurling's test - no provocation of pain  Lumbar spine ruled out with lumbar quadrant test    Active ROM Right eval Left eval  Shoulder flexion [facility]  Shoulder extension [facility]   Shoulder abduction 180 105*  Shoulder adduction    Shoulder internal rotation 70 63  Shoulder external rotation 79 51*  (Blank rows = not tested)  UPPER EXTREMITY MMT:  MMT Right eval Left eval  Shoulder flexion 5 4+  Shoulder extension 4+ 4-  Shoulder abduction 5 3*  Shoulder adduction    Shoulder internal rotation 5 5*  Shoulder external rotation 5 4  Middle trapezius 5 4*  Lower trapezius 3 3*  Latissimus dorsi 4 4  Elbow flexion 5 5  Elbow extension 5 5  (Blank rows = not tested)  SHOULDER SPECIAL TESTS: Impingement tests (on L): Neer impingement test: positive , Hawkins/Kennedy impingement test: positive , and Painful arc test: positive  Rotator cuff assessment: Empty can test: negative and Full can test: negative Biceps assessment: Speed's test: positive   JOINT MOBILITY TESTING:  N/A   PALPATION:  TTP at proximal biceps insertion    TODAY'S TREATMENT:  DATE: 05/17/23  Subjective:  Pt reports 0/10 pain now. She says her exercises and stretches have been going well; not causing any increase in pain. She went to the beach this past weekend and reports no major changes in pain.   FOTO: 74 today (63 initial)  UPPER EXTREMITY ROM:  Active ROM Right eval Left eval  Shoulder flexion [facility] WFL  Shoulder extension [facility]  Shoulder abduction [facility]  Shoulder adduction    Shoulder internal rotation WFL Mild limitation compared to R side with pain  Shoulder external rotation    Elbow flexion    Elbow extension    Wrist flexion    Wrist extension    Wrist ulnar deviation    Wrist radial deviation    Wrist pronation    Wrist supination     (Blank rows = not tested)   UPPER EXTREMITY MMT:  MMT Right eval Left eval  Shoulder flexion 5 5  Shoulder extension 5 5  Shoulder abduction 5 4+!  Shoulder internal rotation 5 5  Shoulder external rotation 5 5  Middle trapezius 5 5  Lower trapezius 3+ 3+  Elbow flexion    Elbow extension    Wrist flexion    Wrist extension    Wrist ulnar deviation    Wrist radial deviation    Wrist pronation    Wrist supination    Grip strength     (Blank rows = not tested)  Therex: UBE warm-up x 6 mins (3 fwd/back)    Not today: Standing L bicep stretch: 2x30 second holds, no pain  Standing Pec Stretch at [employer]: 2x1 minute holds, VC's for finding position that is pain-free in L shoulder Supine Chest Press with bar and 3 + 4# ankle weights: x3 minutes Resisted Shoulder ER: green TB, 1x10 - mild L shoulder pain (2-3/10) Standing Shoulder Horizontal Abduction: green TB, 1x10 - pt cued to keep within pain-free range Standing Shoulder Diagonals: green TB, 1x10, pt cued to keep within pain-free range Resisted Wall Walks with Green TB around wrists: 2 laps each direction Resisted Shoulder Extensions: green TB, 1x10 - no pain Nautilus Lat pulldowns: 50#, x20 - pt cued to align hands on wand  to keep shoulder flexion ROM pain-free  PATIENT EDUCATION: Education details: HEP, POC, goals  Person educated: Patient Education method: Explanation, Demonstration, and Handouts Education comprehension: verbalized understanding and returned demonstration  HOME EXERCISE PROGRAM: Access Code: [email or web address] URL: [email or web address] Date: 05/03/2023 Prepared by: Janet Berlin Exercises - Forearm Supination with Resistance  - 1 x daily - 7 x weekly - 3 sets - 10 reps - Standing Single Arm Elbow Flexion with Resistance  - 1 x daily - 7 x weekly - 3 sets - 10 reps - Scapular Retraction with Resistance  - 1 x daily - 7 x weekly - 3 sets - 10 reps ASSESSMENT:  CLINICAL IMPRESSION: Pt reports to PT with improved L shoulder pain and symptoms since last week. Session today consisted of goal reassessment. Pt demonstrated improvement in all planes of L shoulder AROM; she still had mild limitations with internal rotation secondary to pain but it was improved compared to initial evaluation. Pt also demonstrated improvements in FOTO and L shoulder MMT; she was able to complete all strength tests with no pain except for abduction. Pt agreeable at this time to discharging from PT with plan for her to self-manage symptoms and condition at home with HEP.   OBJECTIVE IMPAIRMENTS: decreased ROM, decreased strength, impaired UE functional use,  and postural dysfunction.   ACTIVITY LIMITATIONS: carrying, lifting, dressing, and reach over head  PARTICIPATION LIMITATIONS: cleaning, laundry, occupation, and yard work  PERSONAL FACTORS: Age, Past/current experiences, and Profession are also affecting patient's functional outcome.   REHAB POTENTIAL: Good  CLINICAL DECISION MAKING: Stable/uncomplicated  EVALUATION COMPLEXITY: Low   GOALS: Goals reviewed with patient? Yes  SHORT TERM GOALS: Target date: 04/26/2023  Patient will be independent in HEP to improve strength/mobility for better  functional independence with ADLs. Baseline: 8/20: HEP initiated Goal status: Goal met  LONG TERM GOALS: Target date: 05/17/2023  Patient will increase FOTO score to equal to or greater than 73 to demonstrate statistically significant improvement in mobility and quality of life.  Baseline: 8/20: 63, 10/1: 74 Goal status: GOAL MET  2.  Patient will increase L UE strength by 1/2 MMT grade to improve ability to complete daily activities and job related tasks.  Baseline: 8/20: see above, see above for updated MMT scores Goal status: GOAL PARTIALLY MET  3.  Patient will improve L shoulder ROM to be symmetrical to R shoulder ROM to be able to perform overhead activities and daily tasks (I.e shutting car door, clasping bra, etc). Baseline:  8/20: see above, 10/1: see above for updated MMT scores Goal status: GOAL PARTIALLY MET  4.  Patient will report <2/10 pain on NRPS to improve tolerance to ADLs and reduced symptoms with activities.  Baseline: 8/20: 5/10 at worst, 10/1: 0/10 Goal status: GOAL MET    PT FREQUENCY: 1-2x/week  PT DURATION: 6 weeks  PLANNED INTERVENTIONS: Therapeutic exercises, Therapeutic activity, Neuromuscular re-education, Patient/Family education, Self Care, Joint mobilization, DME instructions, Spinal mobilization, Cryotherapy, Moist heat, and Manual therapy  PLAN FOR NEXT SESSION: Progress scapular and UE strengthening, shoulder flexion/abd/scaption with light weights in pain free range, biceps strengthening light weights, PNF.  CHECK LTGs  Temprance Wyre, SPT Maylon Peppers, PT, DPT Physical Therapist - [facility] 05/17/23, 5:44 PM

## 2023-05-19 ENCOUNTER — Encounter: Payer: BC Managed Care – PPO | Admitting: Physical Therapy

## 2023-05-25 ENCOUNTER — Encounter: Payer: Self-pay | Admitting: Family Medicine

## 2023-05-25 ENCOUNTER — Telehealth (INDEPENDENT_AMBULATORY_CARE_PROVIDER_SITE_OTHER): Payer: BC Managed Care – PPO | Admitting: Family Medicine

## 2023-05-25 DIAGNOSIS — B309 Viral conjunctivitis, unspecified: Secondary | ICD-10-CM | POA: Diagnosis not present

## 2023-05-25 MED ORDER — NAPHAZOLINE-PHENIRAMINE 0.025-0.3 % OP SOLN
1.0000 [drp] | Freq: Four times a day (QID) | OPHTHALMIC | 0 refills | Status: DC | PRN
Start: 2023-05-25 — End: 2023-09-22

## 2023-05-25 NOTE — Patient Instructions (Addendum)
Wash your hands before and after touching your face/eyes. Increase your use of lubricant eyedrops.   You may also use warm or cool compresses. If you do notice crusting in the coming days, please let me know as this may indicate a more bacterial picture rather than a viral picture.

## 2023-05-25 NOTE — Progress Notes (Signed)
MyChart Video Visit    Virtual Visit via Video Note   This format is felt to be most appropriate for this patient at this time. Physical exam was limited by quality of the video and audio technology used for the visit.   Patient location: Work (in [facility]) Provider location: [employer]  I discussed the limitations of evaluation and management by telemedicine and the availability of in person appointments. The patient expressed understanding and agreed to proceed.  Patient: Deborah Cole   DOB: May 30, 1966   57 y.o. Female  MRN: [id number] Visit Date: 05/25/2023  Today's healthcare provider: Sherlyn Hay, DO   No chief complaint on file.  Subjective    HPI   Woke up with both eyes red, not running (R>L) Normally wears contacts Vision was blurry but didn't notice crusting Took a while in shower to clear out right eye Works with preschoolers. Does not look do think things about the same on a party Cancio slightly pink yeah the with the video but yeah okay thank you this definitely not any better is not does not have a needs  Took an antihistamine this morning, which helped her nasal congestion Feels like she's been fighting something off for the past two weeks  Vision blurry with contacts yesterday (and over the past two days, since working, clearing out an old garage area) and eyes were irritated but didn't look red yesterday; took contacts out as soon as she got home No pain Vision is fine with glasses on Sunlight is bothersome   Usually uses lubricating eyedrops, but not as frequently as normal Eyes feel dry by end of the day    Medications: Outpatient Medications Prior to Visit  Medication Sig   cetirizine (ZYRTEC) 10 MG tablet Take 1 tablet (10 mg total) by mouth daily.   diazepam (VALIUM) 5 MG tablet Take 1 tablet (5 mg total) by mouth every 6 (six) hours as needed for anxiety (MRI).   diclofenac (VOLTAREN) 75 MG EC tablet Take 1 tablet (75 mg  total) by mouth 2 (two) times daily as needed.   fluticasone (FLONASE) 50 MCG/ACT nasal spray Place 2 sprays into both nostrils daily.   meloxicam (MOBIC) 7.5 MG tablet Take 1 tablet (7.5 mg total) by mouth daily.   Multiple Vitamin tablet Take by mouth.   predniSONE (DELTASONE) 10 MG tablet Day 1 & 2 take 6 tablets Day 3 &4 take 5 tablets Day 5 &6 take 4 tablets Day 7 & 8 take 3 tablets Day 9 & 10 take 2 tablets Day 11 & 12 take 1 tablet Day 13 & 14 take 1/2 tablet   No facility-administered medications prior to visit.    Review of Systems  HENT:  Positive for congestion and rhinorrhea.   Eyes:  Positive for photophobia, redness and visual disturbance. Negative for pain, discharge and itching.        Objective    There were no vitals taken for this visit.     Physical Exam Constitutional:      General: She is not in acute distress.    Appearance: Normal appearance.  HENT:     Head: Normocephalic.  Pulmonary:     Effort: Pulmonary effort is normal. No respiratory distress.  Neurological:     Mental Status: She is alert and oriented to person, place, and time. Mental status is at baseline.   Exam limited by virtual visit. Sclera appear slightly reddened bilaterally Pupils equal.   Assessment &  Plan    Acute viral conjunctivitis of both eyes -     Naphazoline-Pheniramine; Place 1 drop into both eyes 4 (four) times daily as needed for eye irritation.  Dispense: 15 mL; Refill: 0  Given patient's recent symptoms of "fighting off" an viral illness, along with her description of her eye symptoms which began this morning after having dealt with eye irritation for the past several days since working in a very dusty/old environment, I suspect her symptoms are more viral than allergic in nature as I would have expected them to be worse the morning following her work clearing on a local rash, rather than several days later.  Additionally, she did have congestive symptoms this morning  which are consistent with a viral illness.   - Will treat with naphazoline-phentermine drops as noted below.   - Gave patient instructions on hand hygiene surrounding touching her face and interacting with her environment, as well as applying cool compresses.   - Advised her to notify the office if her symptoms persist or worsen.   Return if symptoms worsen or fail to improve.     I discussed the assessment and treatment plan with the patient. The patient was provided an opportunity to ask questions and all were answered. The patient agreed with the plan and demonstrated an understanding of the instructions.   The patient was advised to call back or seek an in-person evaluation if the symptoms worsen or if the condition fails to improve as anticipated.  I provided 15 minutes of virtual-face-to-face time during this encounter.   Sherlyn Hay, DO [facility] [phone] (phone) [phone] (fax)  [facility]

## 2023-09-22 ENCOUNTER — Ambulatory Visit (INDEPENDENT_AMBULATORY_CARE_PROVIDER_SITE_OTHER): Payer: 59 | Admitting: Family Medicine

## 2023-09-22 ENCOUNTER — Encounter: Payer: Self-pay | Admitting: Family Medicine

## 2023-09-22 VITALS — BP 126/77 | HR 64 | Ht 62.0 in | Wt 193.0 lb

## 2023-09-22 DIAGNOSIS — Z0001 Encounter for general adult medical examination with abnormal findings: Secondary | ICD-10-CM | POA: Diagnosis not present

## 2023-09-22 DIAGNOSIS — E66811 Obesity, class 1: Secondary | ICD-10-CM | POA: Diagnosis not present

## 2023-09-22 DIAGNOSIS — Z131 Encounter for screening for diabetes mellitus: Secondary | ICD-10-CM

## 2023-09-22 DIAGNOSIS — E7800 Pure hypercholesterolemia, unspecified: Secondary | ICD-10-CM

## 2023-09-22 DIAGNOSIS — Z6835 Body mass index (BMI) 35.0-35.9, adult: Secondary | ICD-10-CM | POA: Diagnosis not present

## 2023-09-22 DIAGNOSIS — Z130 Encounter for screening for diseases of the blood and blood-forming organs and certain disorders involving the immune mechanism: Secondary | ICD-10-CM

## 2023-09-22 DIAGNOSIS — Z0000 Encounter for general adult medical examination without abnormal findings: Secondary | ICD-10-CM

## 2023-09-22 NOTE — Progress Notes (Signed)
 Complete physical exam   Patient: Deborah Cole   DOB: May 01, 1966   58 y.o. Female  MRN: [id number] Visit Date: 09/22/2023  Today's healthcare provider: Rockie Agent, MD   Chief Complaint  Patient presents with   Annual Exam   Subjective    Deborah Cole is a 58 y.o. female who presents today for a complete physical exam.   She reports consuming a general, high protein diet.   The patient has a physically strenuous job, but has no regular exercise apart from work.  She is planning to walk titrate miles with her husband, they aim for 30 mins to one hour weather permitting.   She does not have additional problems to discuss today.   Discussed the use of AI scribe software for clinical note transcription with the patient, who gave verbal consent to proceed.  History of Present Illness   Deborah Cole is a 58 year old female who presents for an annual physical exam.  She is in generally good health with no significant medical issues at present. She is undergoing routine screenings including diabetes screening with A1c, CBC for anemia, metabolic panel for kidney and liver function, and cholesterol levels.  Her diet is described as a 'pretty basic regular diet' with an emphasis on balance. She consumes a lot of protein and some carbohydrates but is not on a strict low-carb diet. She drinks water regularly.  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 Additionally, she and her husband walk for exercise, typically walking for 30 minutes to an hour, weather permitting. She plans to gradually increase her walking distance as the weather improves.  She takes Zyrtec  and Flonase  regularly.      Past Medical History:  Diagnosis Date   Acute stress disorder 01/05/2016   Anxiety    Cancer (HCC)    melanoma   Depression 02/18/2015   Polyp of colon    Vitamin D  deficiency    Past Surgical History:   Procedure Laterality Date   CESAREAN SECTION     COLONOSCOPY WITH PROPOFOL  N/A 03/05/2020   Procedure: COLONOSCOPY WITH PROPOFOL ;  Surgeon: Janalyn Keene NOVAK, MD;  Location: [facility] ENDOSCOPY;  Service: Endoscopy;  Laterality: N/A;   History of Melanoma excision  2005   Malignant   S/P bunionectomy  1984   TUBAL LIGATION     Social History   Socioeconomic History   Marital status: Married    Spouse name: Not on file   Number of children: Not on file   Years of education: Not on file   Highest education level: Not on file  Occupational History   Not on file  Tobacco Use   Smoking status: Never   Smokeless tobacco: Never  Vaping Use   Vaping status: Never Used  Substance and Sexual Activity   Alcohol use: No   Drug use: No   Sexual activity: Yes  Other Topics Concern   Not on file  Social History Narrative   Not on file   Social Drivers of Health   Financial Resource Strain: Not on file  Food Insecurity: Not on file  Transportation Needs: Not on file  Physical Activity: Not on file  Stress: Not on file  Social Connections: Not on file  Intimate Partner Violence: Not on file   Family Status  Relation Name Status   Mother  Alive   Father  Alive   Sister  Alive  Daughter  Alive   Son  Alive   Son  Alive   MGM  (Not Specified)   Neg Hx  (Not Specified)  No partnership data on file   Family History  Problem Relation Age of Onset   Cancer Maternal Grandmother 62       Lung cancer   Breast cancer Neg Hx    No Known Allergies   Medications: Outpatient Medications Prior to Visit  Medication Sig   cetirizine  (ZYRTEC ) 10 MG tablet Take 1 tablet (10 mg total) by mouth daily.   diclofenac  (VOLTAREN ) 75 MG EC tablet Take 1 tablet (75 mg total) by mouth 2 (two) times daily as needed.   fluticasone  (FLONASE ) 50 MCG/ACT nasal spray Place 2 sprays into both nostrils daily.   meloxicam  (MOBIC ) 7.5 MG tablet Take 1 tablet (7.5 mg total) by mouth daily.   Multiple  Vitamin tablet Take by mouth.   [DISCONTINUED] diazepam  (VALIUM ) 5 MG tablet Take 1 tablet (5 mg total) by mouth every 6 (six) hours as needed for anxiety (MRI).   [DISCONTINUED] naphazoline-pheniramine (NAPHCON-A) 0.025-0.3 % ophthalmic solution Place 1 drop into both eyes 4 (four) times daily as needed for eye irritation.   [DISCONTINUED] predniSONE  (DELTASONE ) 10 MG tablet Day 1 & 2 take 6 tablets Day 3 &4 take 5 tablets Day 5 &6 take 4 tablets Day 7 & 8 take 3 tablets Day 9 & 10 take 2 tablets Day 11 & 12 take 1 tablet Day 13 & 14 take 1/2 tablet   No facility-administered medications prior to visit.    Review of Systems  Last CBC Lab Results  Component Value Date   WBC 5.5 09/13/2022   HGB 13.4 09/13/2022   HCT 39.9 09/13/2022   MCV 88 09/13/2022   MCH 29.6 09/13/2022   RDW 11.9 09/13/2022   PLT 242 09/13/2022   Last metabolic panel Lab Results  Component Value Date   GLUCOSE 87 09/13/2022   NA 144 09/13/2022   K 4.6 09/13/2022   CL 106 09/13/2022   CO2 22 09/13/2022   BUN 14 09/13/2022   CREATININE 0.82 09/13/2022   EGFR 85 09/13/2022   CALCIUM 9.8 09/13/2022   PROT 7.1 09/13/2022   ALBUMIN 4.8 09/13/2022   LABGLOB 2.3 09/13/2022   AGRATIO 2.1 09/13/2022   BILITOT 0.5 09/13/2022   ALKPHOS 97 09/13/2022   AST 17 09/13/2022   ALT 21 09/13/2022   Last lipids Lab Results  Component Value Date   CHOL 193 09/13/2022   HDL 59 09/13/2022   LDLCALC 121 (H) 09/13/2022   TRIG 72 09/13/2022   CHOLHDL 3.3 09/13/2022   Last hemoglobin A1c Lab Results  Component Value Date   HGBA1C 5.1 05/13/2021   Last thyroid functions Lab Results  Component Value Date   TSH 2.930 09/13/2022       Objective    BP 126/77   Pulse 64   Ht 5' 2 (1.575 m)   Wt 193 lb (87.5 kg)   BMI 35.30 kg/m  BP Readings from Last 3 Encounters:  09/22/23 126/77  03/22/23 122/78  02/23/23 120/70   Wt Readings from Last 3 Encounters:  09/22/23 193 lb (87.5 kg)  03/22/23 195 lb (88.5  kg)  02/23/23 192 lb (87.1 kg)        Physical Exam Vitals reviewed.  Constitutional:      General: She is not in acute distress.    Appearance: Normal appearance. She is not ill-appearing, toxic-appearing or diaphoretic.  HENT:     Head: Normocephalic and atraumatic.     Right Ear: Tympanic membrane and external ear normal. There is no impacted cerumen.     Left Ear: Tympanic membrane and external ear normal. There is no impacted cerumen.     Nose: Nose normal.     Mouth/Throat:     Pharynx: Oropharynx is clear.  Eyes:     General: No scleral icterus.    Extraocular Movements: Extraocular movements intact.     Conjunctiva/sclera: Conjunctivae normal.     Pupils: Pupils are equal, round, and reactive to light.  Cardiovascular:     Rate and Rhythm: Normal rate and regular rhythm.     Pulses: Normal pulses.     Heart sounds: Normal heart sounds. No murmur heard.    No friction rub. No gallop.  Pulmonary:     Effort: Pulmonary effort is normal. No respiratory distress.     Breath sounds: Normal breath sounds. No wheezing, rhonchi or rales.  Abdominal:     General: Bowel sounds are normal. There is no distension.     Palpations: Abdomen is soft. There is no mass.     Tenderness: There is no abdominal tenderness. There is no guarding.  Musculoskeletal:        General: No deformity.     Cervical back: Normal range of motion and neck supple. No rigidity.     Right lower leg: No edema.     Left lower leg: No edema.  Lymphadenopathy:     Cervical: No cervical adenopathy.  Skin:    General: Skin is warm.     Capillary Refill: Capillary refill takes less than 2 seconds.     Findings: No erythema or rash.  Neurological:     General: No focal deficit present.     Mental Status: She is alert and oriented to person, place, and time.     Motor: No weakness.     Gait: Gait normal.  Psychiatric:        Mood and Affect: Mood normal.        Behavior: Behavior normal.       Last  depression screening scores    09/22/2023    1:13 PM 02/21/2023    1:33 PM 11/09/2022    2:52 PM  PHQ 2/9 Scores  PHQ - 2 Score 0 0 0  PHQ- 9 Score   1    Last fall risk screening    09/22/2023    1:14 PM  Fall Risk   Falls in the past year? 0    Last Audit-C alcohol use screening    11/09/2022    2:53 PM  Alcohol Use Disorder Test (AUDIT)  1. How often do you have a drink containing alcohol? 2  2. How many drinks containing alcohol do you have on a typical day when you are drinking? 0  3. How often do you have six or more drinks on one occasion? 0  AUDIT-C Score 2   A score of 3 or more in women, and 4 or more in men indicates increased risk for alcohol abuse, EXCEPT if all of the points are from question 1   No results found for any visits on 09/22/23.  Assessment & Plan    Routine Health Maintenance and Physical Exam  Immunization History  Administered Date(s) Administered   Influenza,inj,Quad PF,6+ Mos 09/13/2013   Influenza-Unspecified 05/22/2018, 07/01/2022, 07/05/2023   PFIZER(Purple Top)SARS-COV-2 Vaccination 10/14/2019, 11/11/2019, 07/23/2020, 04/29/2021   Pfizer Covid-19  Vaccine Bivalent Booster 5y-11y 07/05/2023   Pfizer(Comirnaty)Fall Seasonal Vaccine 12 years and older 07/01/2022   Td 01/13/1999   Tdap 10/20/2010, 01/28/2022   Zoster Recombinant(Shingrix) 09/03/2020, 12/19/2020    Health Maintenance  Topic Date Due   COVID-19 Vaccine (7 - 2024-25 season) 10/08/2023 (Originally 08/30/2023)   MAMMOGRAM  11/29/2024   Colonoscopy  03/05/2025   Cervical Cancer Screening (HPV/Pap Cotest)  03/10/2025   DTaP/Tdap/Td (4 - Td or Tdap) 01/29/2032   INFLUENZA VACCINE  Completed   Hepatitis C Screening  Completed   HIV Screening  Completed   Zoster Vaccines- Shingrix  Completed   HPV VACCINES  Aged Out    Problem List Items Addressed This Visit       Other   Obesity (BMI 30.0-34.9)   Relevant Orders   CMP14+EGFR   Elevated LDL cholesterol level   Relevant  Orders   Lipid panel   Annual physical exam - Primary   58 year old female presenting for an annual physical examination. No acute complaints. Discussed the importance of regular screenings and preventive health measures. -recommended 150 mins of exercise per week and consuming well balanced diet for healthy weight management  - Order A1c for diabetes screening - Order CBC to check for anemia - Order metabolic panel to assess kidney, liver, and electrolytes - Order lipid panel to assess cholesterol levels      Other Visit Diagnoses       Screening for diabetes mellitus       Relevant Orders   Hemoglobin A1c     Screening for deficiency anemia       Relevant Orders   CBC          Hyperlipidemia Elevated LDL cholesterol. Previous labs showed improvement, but current levels need reassessment. Discussed the importance of monitoring cholesterol to prevent cardiovascular disease. - Order lipid panel to reassess cholesterol levels  Overweight/Obesity BMI in the 30 range. Discussed dietary habits and exercise routine. Patient follows a balanced diet with some carbs and engages in regular physical activity, including walking. Emphasized the importance of maintaining a healthy weight to reduce comorbidities. - Continue current diet and exercise regimen - Monitor weight and BMI  Allergic Rhinitis Takes Zyrtec  and Flonase  with no changes in symptoms. Discussed the benefits of continuing current medications to manage symptoms effectively. - Continue Zyrtec  and Flonase  as needed   No follow-ups on file.       Rockie Agent, MD  [facility] [phone] (phone) [phone] (fax)  [facility]

## 2023-09-22 NOTE — Assessment & Plan Note (Signed)
 58 year old female presenting for an annual physical examination. No acute complaints. Discussed the importance of regular screenings and preventive health measures. -recommended 150 mins of exercise per week and consuming well balanced diet for healthy weight management  - Order A1c for diabetes screening - Order CBC to check for anemia - Order metabolic panel to assess kidney, liver, and electrolytes - Order lipid panel to assess cholesterol levels

## 2023-09-23 ENCOUNTER — Encounter: Payer: Self-pay | Admitting: Family Medicine

## 2023-09-23 LAB — CMP14+EGFR
ALT: 23 [IU]/L (ref 0–32)
AST: 18 [IU]/L (ref 0–40)
Albumin: 4.5 g/dL (ref 3.8–4.9)
Alkaline Phosphatase: 94 [IU]/L (ref 44–121)
BUN/Creatinine Ratio: 20 (ref 9–23)
BUN: 16 mg/dL (ref 6–24)
Bilirubin Total: 0.5 mg/dL (ref 0.0–1.2)
CO2: 25 mmol/L (ref 20–29)
Calcium: 9.8 mg/dL (ref 8.7–10.2)
Chloride: 104 mmol/L (ref 96–106)
Creatinine, Ser: 0.82 mg/dL (ref 0.57–1.00)
Globulin, Total: 2.6 g/dL (ref 1.5–4.5)
Glucose: 86 mg/dL (ref 70–99)
Potassium: 4.4 mmol/L (ref 3.5–5.2)
Sodium: 141 mmol/L (ref 134–144)
Total Protein: 7.1 g/dL (ref 6.0–8.5)
eGFR: 84 mL/min/{1.73_m2} (ref >59)

## 2023-09-23 LAB — CBC
Hematocrit: 39.4 % (ref 34.0–46.6)
Hemoglobin: 13.2 g/dL (ref 11.1–15.9)
MCH: 30.7 pg (ref 26.6–33.0)
MCHC: 33.5 g/dL (ref 31.5–35.7)
MCV: 92 fL (ref 79–97)
Platelets: 260 10*3/uL (ref 150–450)
RBC: 4.3 x10E6/uL (ref 3.77–5.28)
RDW: 12.5 % (ref 11.7–15.4)
WBC: 5.9 10*3/uL (ref 3.4–10.8)

## 2023-09-23 LAB — LIPID PANEL
Chol/HDL Ratio: 3.3 {ratio} (ref 0.0–4.4)
Cholesterol, Total: 204 mg/dL — ABNORMAL HIGH (ref 100–199)
HDL: 61 mg/dL (ref >39)
LDL Chol Calc (NIH): 131 mg/dL — ABNORMAL HIGH (ref 0–99)
Triglycerides: 66 mg/dL (ref 0–149)
VLDL Cholesterol Cal: 12 mg/dL (ref 5–40)

## 2023-09-23 LAB — HEMOGLOBIN A1C
Est. average glucose Bld gHb Est-mCnc: 103 mg/dL
Hgb A1c MFr Bld: 5.2 % (ref 4.8–5.6)

## 2024-02-24 ENCOUNTER — Other Ambulatory Visit: Payer: Self-pay | Admitting: Family Medicine

## 2024-02-24 ENCOUNTER — Encounter: Payer: Self-pay | Admitting: Family Medicine

## 2024-02-24 DIAGNOSIS — Z1231 Encounter for screening mammogram for malignant neoplasm of breast: Secondary | ICD-10-CM

## 2024-03-08 ENCOUNTER — Encounter

## 2024-03-15 ENCOUNTER — Ambulatory Visit
Admission: RE | Admit: 2024-03-15 | Discharge: 2024-03-15 | Disposition: A | Discharge status: Home / Self Care | Source: Ambulatory Visit | Attending: Family Medicine | Admitting: Family Medicine

## 2024-03-15 DIAGNOSIS — Z1231 Encounter for screening mammogram for malignant neoplasm of breast: Secondary | ICD-10-CM | POA: Diagnosis present
